# Patient Record
Sex: Female | Born: 1995 | Race: White | Hispanic: No | Marital: Single | Smoking: Current every day smoker
Health system: Southern US, Community
[De-identification: ages and names within clinical notes are randomized; demographics above are authoritative.]

## PROBLEM LIST (undated history)

## (undated) DIAGNOSIS — A1801 Tuberculosis of spine: Secondary | ICD-10-CM

## (undated) DIAGNOSIS — F419 Anxiety disorder, unspecified: Secondary | ICD-10-CM

---
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
## 2015-11-04 ENCOUNTER — Ambulatory Visit
Admission: EM | Admit: 2015-11-04 | Discharge: 2015-11-04 | Disposition: A | Discharge status: Home / Self Care | Payer: BLUE CROSS/BLUE SHIELD | Attending: Family Medicine | Admitting: Family Medicine

## 2015-11-04 ENCOUNTER — Encounter: Payer: Self-pay | Admitting: Emergency Medicine

## 2015-11-04 DIAGNOSIS — N3001 Acute cystitis with hematuria: Secondary | ICD-10-CM

## 2015-11-04 LAB — URINALYSIS COMPLETE WITH MICROSCOPIC (ARMC ONLY): PH: 0 — AB (ref 5.0–8.0)

## 2015-11-04 MED ORDER — SULFAMETHOXAZOLE-TRIMETHOPRIM 800-160 MG PO TABS: 1.0000 | ORAL_TABLET | Freq: Two times a day (BID) | ORAL | Status: DC

## 2015-11-04 MED ORDER — PHENAZOPYRIDINE HCL 200 MG PO TABS: 200.0000 mg | ORAL_TABLET | Freq: Three times a day (TID) | ORAL | Status: DC

## 2015-11-04 NOTE — ED Provider Notes (Signed)
CSN: [id number]     Arrival date & time 11/04/15  1154 History   First MD Initiated Contact with Patient 11/04/15 1237     Chief Complaint  Patient presents with  . Dysuria   (Consider location/radiation/quality/duration/timing/severity/associated sxs/prior Treatment) HPI   19 year old female who presents with burning when urinating and blood in her urine since yesterday. [place] any back pain nausea vomiting fever or chills. Her first UTI. She is been drinking cranberry juice that her mother gave her but this has not been effective.She denies any vaginal discharge. She denies any pregnancy possibility and states that she is not sexually active.  History reviewed. No pertinent past medical history. History reviewed. No pertinent past surgical history. History reviewed. No pertinent family history. Social History  Substance Use Topics  . Smoking status: Former Games developermoker  . Smokeless tobacco: None  . Alcohol Use: No   OB History    No data available     Review of Systems  Constitutional: Negative for fever, chills and fatigue.  Genitourinary: Positive for dysuria, urgency, frequency, hematuria and vaginal discharge. Negative for flank pain, vaginal bleeding and vaginal pain.  All other systems reviewed and are negative.   Allergies  Review of patient's allergies indicates no known allergies.  Home Medications   Prior to Admission medications   Medication Sig Start Date End Date Taking? Authorizing Provider  phenazopyridine (PYRIDIUM) 200 MG tablet Take 1 tablet (200 mg total) by mouth 3 (three) times daily. 11/04/15   Lutricia FeilWilliam P Alvina Strother, PA-C  sulfamethoxazole-trimethoprim (BACTRIM DS,SEPTRA DS) 800-160 MG tablet Take 1 tablet by mouth 2 (two) times daily. 11/04/15   Lutricia FeilWilliam P Abdulhadi Stopa, PA-C   Meds Ordered and Administered this Visit  Medications - No data to display  BP 116/81 mmHg  Pulse 84  Temp(Src) 96.9 F (36.1 C) (Tympanic)  Resp 16  Ht 5' 3.5" (1.613 m)  Wt 160 lb (72.576  kg)  BMI 27.89 kg/m2  SpO2 100%  LMP 10/25/2015 (Exact Date) No data found.   Physical Exam  Constitutional: She is oriented to person, place, and time. She appears well-developed and well-nourished. No distress.  HENT:  Head: Normocephalic and atraumatic.  Eyes: Conjunctivae are normal. Pupils are equal, round, and reactive to light.  Neck: Normal range of motion. Neck supple.  Pulmonary/Chest: Effort normal and breath sounds normal. No respiratory distress. She has no wheezes. She has no rales.  Abdominal: Soft. Bowel sounds are normal. She exhibits no distension. There is tenderness. There is no rebound and no guarding.  Musculoskeletal: Normal range of motion. She exhibits no tenderness.  Neurological: She is alert and oriented to person, place, and time.  Skin: Skin is warm and dry. No rash noted. She is not diaphoretic. No erythema.  Psychiatric: She has a normal mood and affect. Her behavior is normal. Judgment and thought content normal.  Nursing note and vitals reviewed.   ED Course  Procedures (including critical care time)  Labs Review Labs Reviewed  URINALYSIS COMPLETEWITH MICROSCOPIC ([facility] ONLY) - Abnormal; Notable for the following:    Color, Urine RED (*)    APPearance CLOUDY (*)    Glucose, UA   (*)    Value: TEST NOT REPORTED DUE TO COLOR INTERFERENCE OF URINE PIGMENT   Bilirubin Urine   (*)    Value: TEST NOT REPORTED DUE TO COLOR INTERFERENCE OF URINE PIGMENT   Ketones, ur   (*)    Value: TEST NOT REPORTED DUE TO COLOR INTERFERENCE OF URINE PIGMENT  Hgb urine dipstick   (*)    Value: TEST NOT REPORTED DUE TO COLOR INTERFERENCE OF URINE PIGMENT   pH 0.0 (*)    Protein, ur   (*)    Value: TEST NOT REPORTED DUE TO COLOR INTERFERENCE OF URINE PIGMENT   Nitrite   (*)    Value: TEST NOT REPORTED DUE TO COLOR INTERFERENCE OF URINE PIGMENT   Leukocytes, UA   (*)    Value: TEST NOT REPORTED DUE TO COLOR INTERFERENCE OF URINE PIGMENT   Bacteria, UA FEW (*)     Squamous Epithelial / LPF 0-5 (*)    All other components within normal limits    Imaging Review No results found.   Visual Acuity Review  Right Eye Distance:   Left Eye Distance:   Bilateral Distance:    Right Eye Near:   Left Eye Near:    Bilateral Near:         MDM   1. Acute cystitis with hematuria    Discharge Medication List as of 11/04/2015 12:59 PM    START taking these medications   Details  phenazopyridine (PYRIDIUM) 200 MG tablet Take 1 tablet (200 mg total) by mouth 3 (three) times daily., Starting 11/04/2015, Until Discontinued, Normal    sulfamethoxazole-trimethoprim (BACTRIM DS,SEPTRA DS) 800-160 MG tablet Take 1 tablet by mouth 2 (two) times daily., Starting 11/04/2015, Until Discontinued, Normal      Plan: 1. Test/x-ray results and diagnosis reviewed with patient 2. rx as per orders; risks, benefits, potential side effects reviewed with patient 3. Recommend supportive treatment with fluids. Call 48 hours for final C&S. 4. F/u prn if symptoms worsen or don't improve    Lutricia Feil, PA-C 11/04/15 1304

## 2015-11-04 NOTE — ED Notes (Signed)
Patient c/o burning when urinating and blood in her urine since yesterday.  Patient denies N/V.

## 2015-11-04 NOTE — Discharge Instructions (Signed)

## 2016-05-23 ENCOUNTER — Encounter: Payer: Self-pay | Admitting: Emergency Medicine

## 2016-05-23 DIAGNOSIS — B373 Candidiasis of vulva and vagina: Secondary | ICD-10-CM | POA: Insufficient documentation

## 2016-05-23 DIAGNOSIS — N760 Acute vaginitis: Secondary | ICD-10-CM | POA: Insufficient documentation

## 2016-05-23 DIAGNOSIS — Z87891 Personal history of nicotine dependence: Secondary | ICD-10-CM | POA: Insufficient documentation

## 2016-05-23 LAB — URINALYSIS COMPLETE WITH MICROSCOPIC (ARMC ONLY)
Bilirubin Urine: NEGATIVE
GLUCOSE, UA: NEGATIVE mg/dL
Ketones, ur: NEGATIVE mg/dL
Nitrite: NEGATIVE
PROTEIN: NEGATIVE mg/dL
Specific Gravity, Urine: 1.004 — ABNORMAL LOW (ref 1.005–1.030)
pH: 7 (ref 5.0–8.0)

## 2016-05-23 NOTE — ED Notes (Signed)
Patient ambulatory to triage with steady gait, without difficulty or distress noted; pt reports vaginal pain/irritation since yesterday with no accomp symptoms

## 2016-05-24 ENCOUNTER — Emergency Department
Admission: EM | Admit: 2016-05-24 | Discharge: 2016-05-24 | Disposition: A | Discharge status: Home / Self Care | Payer: BLUE CROSS/BLUE SHIELD | Attending: Emergency Medicine | Admitting: Emergency Medicine

## 2016-05-24 DIAGNOSIS — B3731 Acute candidiasis of vulva and vagina: Secondary | ICD-10-CM

## 2016-05-24 DIAGNOSIS — N760 Acute vaginitis: Secondary | ICD-10-CM

## 2016-05-24 DIAGNOSIS — B373 Candidiasis of vulva and vagina: Secondary | ICD-10-CM

## 2016-05-24 DIAGNOSIS — B9689 Other specified bacterial agents as the cause of diseases classified elsewhere: Secondary | ICD-10-CM

## 2016-05-24 LAB — WET PREP, GENITAL
SPERM: NONE SEEN
Trich, Wet Prep: NONE SEEN

## 2016-05-24 LAB — CHLAMYDIA/NGC RT PCR (ARMC ONLY)
CHLAMYDIA TR: NOT DETECTED
N gonorrhoeae: NOT DETECTED

## 2016-05-24 LAB — POCT PREGNANCY, URINE: Preg Test, Ur: NEGATIVE

## 2016-05-24 MED ORDER — FLUCONAZOLE 50 MG PO TABS
150.0000 mg | ORAL_TABLET | ORAL | Status: AC
  Administered 2016-05-24: 150 mg via ORAL
  Filled 2016-05-24: qty 1

## 2016-05-24 MED ORDER — METRONIDAZOLE 500 MG PO TABS: 500.0000 mg | ORAL_TABLET | Freq: Two times a day (BID) | ORAL | Status: DC

## 2016-05-24 MED ORDER — METRONIDAZOLE 500 MG PO TABS
500.0000 mg | ORAL_TABLET | Freq: Once | ORAL | Status: AC
  Administered 2016-05-24: 500 mg via ORAL
  Filled 2016-05-24: qty 1

## 2016-05-24 MED ORDER — KETOROLAC TROMETHAMINE 60 MG/2ML IM SOLN
30.0000 mg | Freq: Once | INTRAMUSCULAR | Status: AC
  Administered 2016-05-24: 30 mg via INTRAMUSCULAR
  Filled 2016-05-24: qty 2

## 2016-05-24 MED ORDER — FLUCONAZOLE 150 MG PO TABS: ORAL_TABLET | ORAL | Status: AC

## 2016-05-24 NOTE — ED Provider Notes (Signed)
[facility] Emergency Department Provider Note  ____________________________________________  Time seen: Approximately 1:50 AM  I have reviewed the triage vital signs and the nursing notes.   HISTORY  Chief Complaint Vaginitis    HPI Kristy Knight is a 20 y.o. female who presents with 2 days of worsening vaginal/vulvar pain.  She reports that yesterday the pain was mild but it is gradually gotten worse and it feels like a severe burning at all times.  It is worse with standing and walking.  She denies any vaginal discharge.  It hurts when she urinates as well but because it hurts all the time she cannot say if it makes it worse or not.  She denies fever/chills, chest pain, shortness of breath, nausea, vomiting, diarrhea, abdominal pain.  She is sexually active with one partner and states that they always use condoms.  It is a relatively new relationship.  She has not noticed any rash in the area but it is so painful she is not certain.   History reviewed. No pertinent past medical history.  There are no active problems to display for this patient.   History reviewed. No pertinent past surgical history.  Current Outpatient Rx  Name  Route  Sig  Dispense  Refill  . fluconazole (DIFLUCAN) 150 MG tablet      Take 1 tablet (150 mg) by mouth if you still have symptoms on July 2nd (72 hours after initial dose)   1 tablet   0   . metroNIDAZOLE (FLAGYL) 500 MG tablet   Oral   Take 1 tablet (500 mg total) by mouth 2 (two) times daily.   14 tablet   0   . phenazopyridine (PYRIDIUM) 200 MG tablet   Oral   Take 1 tablet (200 mg total) by mouth 3 (three) times daily.   6 tablet   0   . sulfamethoxazole-trimethoprim (BACTRIM DS,SEPTRA DS) 800-160 MG tablet   Oral   Take 1 tablet by mouth 2 (two) times daily.   10 tablet   0     Allergies Review of patient's allergies indicates no known allergies.  No family history on file.  Social  History Social History  Substance Use Topics  . Smoking status: Former Smoker -- 0.50 packs/day    Types: Cigarettes  . Smokeless tobacco: None  . Alcohol Use: No    Review of Systems Constitutional: No fever/chills Cardiovascular: Denies chest pain. Respiratory: Denies shortness of breath. Gastrointestinal: No abdominal pain.  No nausea, no vomiting.  No diarrhea.  No constipation. Genitourinary: +vulvar/vaginal pain and +dysuria.  Denies vaginal discharge Musculoskeletal: Negative for back pain. Skin: Negative for rash. Neurological: Negative for headaches, focal weakness or numbness.  10-point ROS otherwise negative.  ____________________________________________   PHYSICAL EXAM:  VITAL SIGNS: ED Triage Vitals  Enc Vitals Group     BP 05/23/16 2334 137/91 mmHg     Pulse Rate 05/23/16 2334 87     Resp 05/23/16 2334 18     Temp 05/23/16 2334 97.9 F (36.6 C)     Temp Source 05/23/16 2334 Oral     SpO2 05/23/16 2334 100 %     Weight 05/23/16 2334 170 lb (77.111 kg)     Height 05/23/16 2334 5\' 3"  (1.6 m)     Head Cir --      Peak Flow --      Pain Score 05/23/16 2332 6     Pain Loc --  Pain Edu? --      Excl. in GC? --     Constitutional: Alert and oriented. Appears to be uncomfortable Head: Atraumatic. Neck: No stridor.  No meningeal signs.   Cardiovascular: Normal rate, regular rhythm. Good peripheral circulation. Grossly normal heart sounds.   Respiratory: Normal respiratory effort.  No retractions. Lungs CTAB. Gastrointestinal: Soft and nontender. No distention.  Genitourinary:  Severe tenderness to palpation of the labia majora and minora with some whitish discharge and irritation of the skin but without any vesicular lesions, abscesses, or other evidence of infection.  There is copious white thick discharge in the vaginal vault but no evidence of cervicitis.  The tenderness seems to be mostly external rather than internal Neurologic:  Normal speech and  language. No gross focal neurologic deficits are appreciated.  Skin:  Skin is warm, dry and intact. No rash noted. Psychiatric: Mood and affect are normal. Speech and behavior are normal.  ____________________________________________   LABS (all labs ordered are listed, but only abnormal results are displayed)  Labs Reviewed  WET PREP, GENITAL - Abnormal; Notable for the following:    Yeast Wet Prep HPF POC PRESENT (*)    Clue Cells Wet Prep HPF POC PRESENT (*)    WBC, Wet Prep HPF POC FEW (*)    All other components within normal limits  URINALYSIS COMPLETEWITH MICROSCOPIC ([facility] ONLY) - Abnormal; Notable for the following:    Color, Urine STRAW (*)    APPearance HAZY (*)    Specific Gravity, Urine 1.004 (*)    Hgb urine dipstick 1+ (*)    Leukocytes, UA 1+ (*)    Bacteria, UA MANY (*)    Squamous Epithelial / LPF 0-5 (*)    All other components within normal limits  CHLAMYDIA/NGC RT PCR ([facility] ONLY)  URINE CULTURE  POCT PREGNANCY, URINE   ____________________________________________  EKG  None ____________________________________________  RADIOLOGY   No results found.  ____________________________________________   PROCEDURES  Procedure(s) performed:   Procedures   ____________________________________________   INITIAL IMPRESSION / ASSESSMENT AND PLAN / ED COURSE  Pertinent labs & imaging results that were available during my care of the patient were reviewed by me and considered in my medical decision making (see chart for details).  Candidal vulvovaginitis with bacterial vaginosis as well.  Gave one dose fluconazole 150 mg by mouth in the ED and a prescription for another pill if she still is symptomatic in 72 hours.  Also gave Flagyl pill and Rx.  GC chlamydia negative.  Urine culture pending.  I gave my usual and customary return precautions.      ____________________________________________  FINAL CLINICAL IMPRESSION(S) / ED  DIAGNOSES  Final diagnoses:  Candidal vulvovaginitis  Bacterial vaginosis     MEDICATIONS GIVEN DURING THIS VISIT:  Medications  ketorolac (TORADOL) injection 30 mg (30 mg Intramuscular Given 05/24/16 0238)  fluconazole (DIFLUCAN) tablet 150 mg (150 mg Oral Given 05/24/16 0311)  metroNIDAZOLE (FLAGYL) tablet 500 mg (500 mg Oral Given 05/24/16 0313)     NEW OUTPATIENT MEDICATIONS STARTED DURING THIS VISIT:  New Prescriptions   FLUCONAZOLE (DIFLUCAN) 150 MG TABLET    Take 1 tablet (150 mg) by mouth if you still have symptoms on July 2nd (72 hours after initial dose)   METRONIDAZOLE (FLAGYL) 500 MG TABLET    Take 1 tablet (500 mg total) by mouth 2 (two) times daily.      Note:  This document was prepared using Dragon voice recognition software and may include unintentional  dictation errors.   Loleta Rose, MD 05/24/16 [id number]

## 2016-05-24 NOTE — Discharge Instructions (Signed)
If your urine culture or other swabs indicate that you need additional antibiotics, a nurse will call you to follow up.  Bacterial Vaginosis Bacterial vaginosis is a vaginal infection that occurs when the normal balance of bacteria in the vagina is disrupted. It results from an overgrowth of certain bacteria. This is the most common vaginal infection in women of childbearing age. Treatment is important to prevent complications, especially in pregnant women, as it can cause a premature delivery. CAUSES  Bacterial vaginosis is caused by an increase in harmful bacteria that are normally present in smaller amounts in the vagina. Several different kinds of bacteria can cause bacterial vaginosis. However, the reason that the condition develops is not fully understood. RISK FACTORS Certain activities or behaviors can put you at an increased risk of developing bacterial vaginosis, including:  Having a new sex partner or multiple sex partners.  Douching.  Using an intrauterine device (IUD) for contraception. Women do not get bacterial vaginosis from toilet seats, bedding, swimming pools, or contact with objects around them. SIGNS AND SYMPTOMS  Some women with bacterial vaginosis have no signs or symptoms. Common symptoms include:  Grey vaginal discharge.  A fishlike odor with discharge, especially after sexual intercourse.  Itching or burning of the vagina and vulva.  Burning or pain with urination. DIAGNOSIS  Your health care provider will take a medical history and examine the vagina for signs of bacterial vaginosis. A sample of vaginal fluid may be taken. Your health care provider will look at this sample under a microscope to check for bacteria and abnormal cells. A vaginal pH test may also be done.  TREATMENT  Bacterial vaginosis may be treated with antibiotic medicines. These may be given in the form of a pill or a vaginal cream. A second round of antibiotics may be prescribed if the  condition comes back after treatment. Because bacterial vaginosis increases your risk for sexually transmitted diseases, getting treated can help reduce your risk for chlamydia, gonorrhea, HIV, and herpes. HOME CARE INSTRUCTIONS   Only take over-the-counter or prescription medicines as directed by your health care provider.  If antibiotic medicine was prescribed, take it as directed. Make sure you finish it even if you start to feel better.  Tell all sexual partners that you have a vaginal infection. They should see their health care provider and be treated if they have problems, such as a mild rash or itching.  During treatment, it is important that you follow these instructions:  Avoid sexual activity or use condoms correctly.  Do not douche.  Avoid alcohol as directed by your health care provider.  Avoid breastfeeding as directed by your health care provider. SEEK MEDICAL CARE IF:   Your symptoms are not improving after 3 days of treatment.  You have increased discharge or pain.  You have a fever. MAKE SURE YOU:   Understand these instructions.  Will watch your condition.  Will get help right away if you are not doing well or get worse. FOR MORE INFORMATION  Centers for Disease Control and Prevention, Division of STD Prevention: SolutionApps.co.zawww.cdc.gov/std American Sexual Health Association (ASHA): www.ashastd.org    This information is not intended to replace advice given to you by your health care provider. Make sure you discuss any questions you have with your health care provider.   Document Released: 11/12/2005 Document Revised: 12/03/2014 Document Reviewed: 06/24/2013 Elsevier Interactive Patient Education 2016 Elsevier Inc.  Bacterial Vaginosis Bacterial vaginosis is a vaginal infection that occurs when the normal balance  of bacteria in the vagina is disrupted. It results from an overgrowth of certain bacteria. This is the most common vaginal infection in women of  childbearing age. Treatment is important to prevent complications, especially in pregnant women, as it can cause a premature delivery. CAUSES  Bacterial vaginosis is caused by an increase in harmful bacteria that are normally present in smaller amounts in the vagina. Several different kinds of bacteria can cause bacterial vaginosis. However, the reason that the condition develops is not fully understood. RISK FACTORS Certain activities or behaviors can put you at an increased risk of developing bacterial vaginosis, including:  Having a new sex partner or multiple sex partners.  Douching.  Using an intrauterine device (IUD) for contraception. Women do not get bacterial vaginosis from toilet seats, bedding, swimming pools, or contact with objects around them. SIGNS AND SYMPTOMS  Some women with bacterial vaginosis have no signs or symptoms. Common symptoms include:  Grey vaginal discharge.  A fishlike odor with discharge, especially after sexual intercourse.  Itching or burning of the vagina and vulva.  Burning or pain with urination. DIAGNOSIS  Your health care provider will take a medical history and examine the vagina for signs of bacterial vaginosis. A sample of vaginal fluid may be taken. Your health care provider will look at this sample under a microscope to check for bacteria and abnormal cells. A vaginal pH test may also be done.  TREATMENT  Bacterial vaginosis may be treated with antibiotic medicines. These may be given in the form of a pill or a vaginal cream. A second round of antibiotics may be prescribed if the condition comes back after treatment. Because bacterial vaginosis increases your risk for sexually transmitted diseases, getting treated can help reduce your risk for chlamydia, gonorrhea, HIV, and herpes. HOME CARE INSTRUCTIONS   Only take over-the-counter or prescription medicines as directed by your health care provider.  If antibiotic medicine was prescribed, take  it as directed. Make sure you finish it even if you start to feel better.  Tell all sexual partners that you have a vaginal infection. They should see their health care provider and be treated if they have problems, such as a mild rash or itching.  During treatment, it is important that you follow these instructions:  Avoid sexual activity or use condoms correctly.  Do not douche.  Avoid alcohol as directed by your health care provider.  Avoid breastfeeding as directed by your health care provider. SEEK MEDICAL CARE IF:   Your symptoms are not improving after 3 days of treatment.  You have increased discharge or pain.  You have a fever. MAKE SURE YOU:   Understand these instructions.  Will watch your condition.  Will get help right away if you are not doing well or get worse. FOR MORE INFORMATION  Centers for Disease Control and Prevention, Division of STD Prevention: SolutionApps.co.zawww.cdc.gov/std American Sexual Health Association (ASHA): www.ashastd.org    This information is not intended to replace advice given to you by your health care provider. Make sure you discuss any questions you have with your health care provider.   Document Released: 11/12/2005 Document Revised: 12/03/2014 Document Reviewed: 06/24/2013 Elsevier Interactive Patient Education [employer]2016 Elsevier Inc.

## 2016-05-25 LAB — URINE CULTURE: Special Requests: NORMAL

## 2017-03-27 ENCOUNTER — Ambulatory Visit
Admission: EM | Admit: 2017-03-27 | Discharge: 2017-03-27 | Disposition: A | Discharge status: Home / Self Care | Payer: BLUE CROSS/BLUE SHIELD | Attending: Family Medicine | Admitting: Family Medicine

## 2017-03-27 ENCOUNTER — Encounter: Payer: Self-pay | Admitting: *Deleted

## 2017-03-27 DIAGNOSIS — R05 Cough: Secondary | ICD-10-CM | POA: Diagnosis not present

## 2017-03-27 DIAGNOSIS — R197 Diarrhea, unspecified: Secondary | ICD-10-CM | POA: Diagnosis not present

## 2017-03-27 DIAGNOSIS — R112 Nausea with vomiting, unspecified: Secondary | ICD-10-CM

## 2017-03-27 DIAGNOSIS — J069 Acute upper respiratory infection, unspecified: Secondary | ICD-10-CM

## 2017-03-27 DIAGNOSIS — B9789 Other viral agents as the cause of diseases classified elsewhere: Secondary | ICD-10-CM

## 2017-03-27 LAB — RAPID STREP SCREEN (MED CTR MEBANE ONLY): STREPTOCOCCUS, GROUP A SCREEN (DIRECT): NEGATIVE

## 2017-03-27 MED ORDER — ONDANSETRON 8 MG PO TBDP: 8.0000 mg | ORAL_TABLET | Freq: Three times a day (TID) | ORAL | 0 refills | Status: DC | PRN

## 2017-03-27 NOTE — ED Provider Notes (Signed)
[facility]    CSN: [id number] Arrival date & time: 03/27/17  1521     History   Chief Complaint Chief Complaint  Patient presents with  . Sore Throat  . Cough  . Fever  . Dizziness  . Emesis    HPI Kristy Knight is a 21 y.o. female.   The history is provided by the patient.  URI  Presenting symptoms: congestion, cough, rhinorrhea and sore throat   Severity:  Moderate Onset quality:  Sudden Duration:  3 days Timing:  Constant Progression:  Worsening Chronicity:  New Relieved by:  Nothing Ineffective treatments: dizziness, vomiting once or twice daily and mild diarrhea; no blood in emesis or stool. Associated symptoms: no headaches, no myalgias, no neck pain, no sinus pain and no wheezing   Risk factors: sick contacts   Risk factors: not elderly, no chronic cardiac disease, no chronic kidney disease, no chronic respiratory disease, no diabetes mellitus, no immunosuppression, no recent illness and no recent travel     History reviewed. No pertinent past medical history.  There are no active problems to display for this patient.   History reviewed. No pertinent surgical history.  OB History    No data available       Home Medications    Prior to Admission medications   Medication Sig Start Date End Date Taking? Authorizing Provider  metroNIDAZOLE (FLAGYL) 500 MG tablet Take 1 tablet (500 mg total) by mouth 2 (two) times daily. 05/24/16   Loleta Rose, MD  ondansetron (ZOFRAN ODT) 8 MG disintegrating tablet Take 1 tablet (8 mg total) by mouth every 8 (eight) hours as needed. 03/27/17   Payton Mccallum, MD  phenazopyridine (PYRIDIUM) 200 MG tablet Take 1 tablet (200 mg total) by mouth 3 (three) times daily. 11/04/15   Lutricia Feil, PA-C  sulfamethoxazole-trimethoprim (BACTRIM DS,SEPTRA DS) 800-160 MG tablet Take 1 tablet by mouth 2 (two) times daily. 11/04/15   Lutricia Feil, PA-C    Family History History reviewed. No pertinent family  history.  Social History Social History  Substance Use Topics  . Smoking status: Current Every Day Smoker    Packs/day: 1.00    Types: Cigarettes  . Smokeless tobacco: Never Used  . Alcohol use No     Allergies   Patient has no known allergies.   Review of Systems Review of Systems  HENT: Positive for congestion, rhinorrhea and sore throat. Negative for sinus pain.   Respiratory: Positive for cough. Negative for wheezing.   Musculoskeletal: Negative for myalgias and neck pain.  Neurological: Negative for headaches.     Physical Exam Triage Vital Signs ED Triage Vitals  Enc Vitals Group     BP 03/27/17 1612 (!) 117/91     Pulse Rate 03/27/17 1612 83     Resp 03/27/17 1612 16     Temp 03/27/17 1612 98.2 F (36.8 C)     Temp Source 03/27/17 1612 Oral     SpO2 03/27/17 1612 100 %     Weight 03/27/17 1613 170 lb (77.1 kg)     Height 03/27/17 1613  (1.6 m)     Head Circumference --      Peak Flow --      Pain Score 03/27/17 1614 0     Pain Loc --      Pain Edu? --      Excl. in GC? --    No data found.   Updated Vital Signs BP (!) 117/91 (  BP Location: Left Arm)   Pulse 83   Temp 98.2 F (36.8 C) (Oral)   Resp 16   Ht  (1.6 m)   Wt 170 lb (77.1 kg)   LMP  (LMP Unknown)   SpO2 100%   BMI 30.11 kg/m   Visual Acuity Right Eye Distance:   Left Eye Distance:   Bilateral Distance:    Right Eye Near:   Left Eye Near:    Bilateral Near:     Physical Exam  Constitutional: She appears well-developed and well-nourished. No distress.  HENT:  Head: Normocephalic and atraumatic.  Right Ear: Tympanic membrane, external ear and ear canal normal.  Left Ear: Tympanic membrane, external ear and ear canal normal.  Nose: Rhinorrhea present. No mucosal edema, nose lacerations, sinus tenderness, nasal deformity, septal deviation or nasal septal hematoma. No epistaxis.  No foreign bodies. Right sinus exhibits no maxillary sinus tenderness and no frontal sinus  tenderness. Left sinus exhibits no maxillary sinus tenderness and no frontal sinus tenderness.  Mouth/Throat: Uvula is midline, oropharynx is clear and moist and mucous membranes are normal. No oropharyngeal exudate.  Eyes: Conjunctivae and EOM are normal. Pupils are equal, round, and reactive to light. Right eye exhibits no discharge. Left eye exhibits no discharge. No scleral icterus.  Neck: Normal range of motion. Neck supple. No thyromegaly present.  Cardiovascular: Normal rate, regular rhythm and normal heart sounds.   Pulmonary/Chest: Effort normal and breath sounds normal. No respiratory distress. She has no wheezes. She has no rales.  Abdominal: Soft. Bowel sounds are normal. She exhibits no distension and no mass. There is no tenderness. There is no rebound and no guarding. No hernia.  Lymphadenopathy:    She has no cervical adenopathy.  Skin: No rash noted. She is not diaphoretic.  Nursing note and vitals reviewed.    UC Treatments / Results  Labs (all labs ordered are listed, but only abnormal results are displayed) Labs Reviewed  RAPID STREP SCREEN (NOT AT [facility])  CULTURE, GROUP A STREP [facility])    EKG  EKG Interpretation None       Radiology No results found.  Procedures Procedures (including critical care time)  Medications Ordered in UC Medications - No data to display   Initial Impression / Assessment and Plan / UC Course  I have reviewed the triage vital signs and the nursing notes.  Pertinent labs & imaging results that were available during my care of the patient were reviewed by me and considered in my medical decision making (see chart for details).       Final Clinical Impressions(s) / UC Diagnoses   Final diagnoses:  Viral URI with cough  Non-intractable vomiting with nausea, unspecified vomiting type  Diarrhea, unspecified type    New Prescriptions Discharge Medication List as of 03/27/2017  5:21 PM    START taking these medications    Details  ondansetron (ZOFRAN ODT) 8 MG disintegrating tablet Take 1 tablet (8 mg total) by mouth every 8 (eight) hours as needed., Starting Wed 03/27/2017, Normal       1. Lab results and diagnosis reviewed with patient 2. rx as per orders above; reviewed possible side effects, interactions, risks and benefits  3. Recommend supportive treatment with rest, fluids, salt water gargles 4. Follow-up prn if symptoms worsen or don't improve   Payton Mccallum, MD 03/27/17 1820

## 2017-03-27 NOTE — ED Triage Notes (Signed)
Patient started having symptoms of sore throat, cough, dizziness, and vomiting 2 days ago.

## 2017-03-30 LAB — CULTURE, GROUP A STREP (THRC)

## 2017-06-02 ENCOUNTER — Encounter: Payer: Self-pay | Admitting: Emergency Medicine

## 2017-06-02 DIAGNOSIS — M79672 Pain in left foot: Secondary | ICD-10-CM | POA: Diagnosis not present

## 2017-06-02 DIAGNOSIS — R202 Paresthesia of skin: Secondary | ICD-10-CM | POA: Diagnosis not present

## 2017-06-02 DIAGNOSIS — R200 Anesthesia of skin: Secondary | ICD-10-CM | POA: Diagnosis present

## 2017-06-02 DIAGNOSIS — M79671 Pain in right foot: Secondary | ICD-10-CM | POA: Diagnosis not present

## 2017-06-02 DIAGNOSIS — F17210 Nicotine dependence, cigarettes, uncomplicated: Secondary | ICD-10-CM | POA: Insufficient documentation

## 2017-06-02 LAB — POCT PREGNANCY, URINE: PREG TEST UR: NEGATIVE

## 2017-06-02 NOTE — ED Triage Notes (Signed)
Pt presents to ED with c/o pain and numbness to her feet since Wednesday and numbness to her left hand tonight. Pt states she was at work when her hand suddenly went numb and she dropped food she was carrying.  Pt states her hand does not have any pain at this time. Mom is concerned because diabetes runs in their family. Pt reports frequent urination but states she is drinking water all the time so she thinks it may be due to that. Denies any other symptoms at this time.

## 2017-06-03 ENCOUNTER — Emergency Department
Admission: EM | Admit: 2017-06-03 | Discharge: 2017-06-03 | Disposition: A | Discharge status: Home / Self Care | Payer: BLUE CROSS/BLUE SHIELD | Attending: Emergency Medicine | Admitting: Emergency Medicine

## 2017-06-03 ENCOUNTER — Emergency Department: Payer: BLUE CROSS/BLUE SHIELD

## 2017-06-03 ENCOUNTER — Encounter: Payer: Self-pay | Admitting: Emergency Medicine

## 2017-06-03 DIAGNOSIS — R202 Paresthesia of skin: Secondary | ICD-10-CM

## 2017-06-03 DIAGNOSIS — M79671 Pain in right foot: Secondary | ICD-10-CM

## 2017-06-03 DIAGNOSIS — M79672 Pain in left foot: Secondary | ICD-10-CM

## 2017-06-03 HISTORY — DX: Anxiety disorder, unspecified: F41.9

## 2017-06-03 LAB — COMPREHENSIVE METABOLIC PANEL
ALBUMIN: 4.5 g/dL (ref 3.5–5.0)
ALT: 83 U/L — ABNORMAL HIGH (ref 14–54)
ANION GAP: 7 (ref 5–15)
AST: 54 U/L — ABNORMAL HIGH (ref 15–41)
Alkaline Phosphatase: 55 U/L (ref 38–126)
BUN: 13 mg/dL (ref 6–20)
CHLORIDE: 106 mmol/L (ref 101–111)
CO2: 28 mmol/L (ref 22–32)
Calcium: 9.7 mg/dL (ref 8.9–10.3)
Creatinine, Ser: 0.63 mg/dL (ref 0.44–1.00)
GFR calc non Af Amer: >60 mL/min (ref >60)
GLUCOSE: 91 mg/dL (ref 65–99)
Potassium: 4.2 mmol/L (ref 3.5–5.1)
SODIUM: 141 mmol/L (ref 135–145)
Total Bilirubin: 0.7 mg/dL (ref 0.3–1.2)
Total Protein: 7.4 g/dL (ref 6.5–8.1)

## 2017-06-03 LAB — CBC WITH DIFFERENTIAL/PLATELET
BASOS PCT: 1 %
Basophils Absolute: 0.1 10*3/uL (ref 0–0.1)
EOS ABS: 0.3 10*3/uL (ref 0–0.7)
EOS PCT: 5 %
HCT: 39.7 % (ref 35.0–47.0)
Hemoglobin: 13.3 g/dL (ref 12.0–16.0)
LYMPHS ABS: 2.4 10*3/uL (ref 1.0–3.6)
Lymphocytes Relative: 42 %
MCH: 30 pg (ref 26.0–34.0)
MCHC: 33.6 g/dL (ref 32.0–36.0)
MCV: 89.3 fL (ref 80.0–100.0)
MONOS PCT: 8 %
Monocytes Absolute: 0.5 10*3/uL (ref 0.2–0.9)
Neutro Abs: 2.5 10*3/uL (ref 1.4–6.5)
Neutrophils Relative %: 44 %
PLATELETS: 131 10*3/uL — AB (ref 150–440)
RBC: 4.44 MIL/uL (ref 3.80–5.20)
RDW: 12.3 % (ref 11.5–14.5)
WBC: 5.7 10*3/uL (ref 3.6–11.0)

## 2017-06-03 LAB — URINALYSIS, COMPLETE (UACMP) WITH MICROSCOPIC
BILIRUBIN URINE: NEGATIVE
Glucose, UA: NEGATIVE mg/dL
Hgb urine dipstick: NEGATIVE
KETONES UR: NEGATIVE mg/dL
LEUKOCYTES UA: NEGATIVE
Nitrite: NEGATIVE
PH: 6 (ref 5.0–8.0)
PROTEIN: NEGATIVE mg/dL
RBC / HPF: NONE SEEN RBC/hpf (ref 0–5)
Specific Gravity, Urine: 1.004 — ABNORMAL LOW (ref 1.005–1.030)

## 2017-06-03 LAB — GLUCOSE, CAPILLARY: Glucose-Capillary: 109 mg/dL — ABNORMAL HIGH (ref 65–99)

## 2017-06-03 NOTE — ED Notes (Signed)
MRI tech ellen called to notify of MRI. Pt removing jewelry in preparation for exam.

## 2017-06-03 NOTE — ED Notes (Signed)
Pt states has had bilateral foot pain with numbness for a week. 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 Pt with left hand grip that is less than right. Pt states decreased sensation to left hand during assessment. Pt  Complains of 2nd-5th toe bilaterally less sensation than great toes.

## 2017-06-03 NOTE — ED Provider Notes (Signed)
[facility] Emergency Department Provider Note  ____________________________________________   First MD Initiated Contact with Patient 06/03/17 [phone]     (approximate)  I have reviewed the triage vital signs and the nursing notes.   HISTORY  Chief Complaint Numbness    HPI Kristy Knight is a 21 y.o. female with a past medical history of anxietywho presents for evaluation of a variety of symptoms.  She is most concerned about symptoms that started yesterday afternoon or evening.  She is at work and reports that she was making a smoothie with her left hand when suddenly she dropped a smoothie.  She reports she had decreased sensation throughout her left arm and could not make it work correctly, with weakness and decreased mobility.  She denies any pain but reports loss of sensation and loss of motor function.  The symptoms have improved somewhat since that acute onset that occurred more than 8 hours ago, but she still reports weakness and numbness in the arm, feeling similar to when the arm falls asleep when you lie on it.  She denies any headache, neck pain, shortness of breath, chest pain, nausea, vomiting.  She describes symptoms as severe.  Nothing in particular makes the patient's symptoms better nor worse.    She also reports that for 4 or 5 days she has had pain and numbness in the bottoms of her feet.  On the left foot is mostly on the balls of her feet and on the right foot it is mostly her heel.  Standing on her feet all day long at work makes it worse and nothing in particular makes it better.  The symptoms are mild.  It feels like pins and needles and sharp pain.  No history of trauma or other injury.  Past Medical History:  Diagnosis Date  . Anxiety     There are no active problems to display for this patient.   History reviewed. No pertinent surgical history.  Prior to Admission medications   Medication Sig Start Date End Date Taking?  Authorizing Provider  ondansetron (ZOFRAN ODT) 8 MG disintegrating tablet Take 1 tablet (8 mg total) by mouth every 8 (eight) hours as needed. Patient not taking: Reported on 06/03/2017 03/27/17   Payton Mccallum, MD  phenazopyridine (PYRIDIUM) 200 MG tablet Take 1 tablet (200 mg total) by mouth 3 (three) times daily. Patient not taking: Reported on 06/03/2017 11/04/15   Lutricia Feil, PA-C    Allergies Patient has no known allergies.  History reviewed. No pertinent family history.  Social History Social History  Substance Use Topics  . Smoking status: Current Every Day Smoker    Packs/day: 1.00    Types: Cigarettes  . Smokeless tobacco: Never Used  . Alcohol use No    Review of Systems Constitutional: No fever/chills Eyes: No visual changes. ENT: No sore throat. Cardiovascular: Denies chest pain. Respiratory: Denies shortness of breath. Gastrointestinal: No abdominal pain.  No nausea, no vomiting.  No diarrhea.  No constipation. Genitourinary: Negative for dysuria. Musculoskeletal: Negative for neck pain.  Negative for back pain. Integumentary: Negative for rash. Neurological: Negative for headaches, focal weakness or numbness.   ____________________________________________   PHYSICAL EXAM:  VITAL SIGNS: ED Triage Vitals  Enc Vitals Group     BP 06/02/17 2319 (!) 142/96     Pulse Rate 06/02/17 2319 88     Resp 06/02/17 2319 16     Temp 06/02/17 2319 98.5 F (36.9 C)  Temp Source 06/02/17 2319 Oral     SpO2 06/02/17 2319 100 %     Weight 06/02/17 2318 79.4 kg (175 lb)     Height 06/02/17 2318 1.613 m (5' 3.5")     Head Circumference --      Peak Flow --      Pain Score 06/02/17 2331 4     Pain Loc --      Pain Edu? --      Excl. in GC? --     Constitutional: Alert and oriented. Well appearing and in no acute distress. Eyes: Conjunctivae are normal. PERRL. EOMI. Head: Atraumatic. Nose: No congestion/rhinnorhea. Mouth/Throat: Mucous membranes are  moist. Neck: No stridor.  No meningeal signs.  No cervical spine tenderness to palpation. Cardiovascular: Normal rate, regular rhythm. Good peripheral circulation. Grossly normal heart sounds. Respiratory: Normal respiratory effort.  No retractions. Lungs CTAB. Gastrointestinal: Soft and nontender. No distention.  Musculoskeletal: No lower extremity tenderness nor edema. No gross deformities of extremities.Tenderness to palpation of the heel of the right foot on the plantar surface and the plantar surface of the ball of the feet on the left foot. Neurologic:  Normal speech and language.  On exam, the patient reports decreased sensation to light touch throughout her left upper extremity.  She has decreased strength with grip, biceps flexion, and tricep extension with the left upper extremity compared to the right.  Her right lower extremity strength is intact and equal bilaterally.  She reports some numbness in the bottoms of her feet Skin:  Skin is warm, dry and intact. No rash noted. Psychiatric: Mood and affect are normal. Speech and behavior are normal.  ____________________________________________   LABS (all labs ordered are listed, but only abnormal results are displayed)  Labs Reviewed  CBC WITH DIFFERENTIAL/PLATELET - Abnormal; Notable for the following:       Result Value   Platelets 131 (*)    All other components within normal limits  COMPREHENSIVE METABOLIC PANEL - Abnormal; Notable for the following:    AST 54 (*)    ALT 83 (*)    All other components within normal limits  URINALYSIS, COMPLETE (UACMP) WITH MICROSCOPIC - Abnormal; Notable for the following:    Color, Urine STRAW (*)    APPearance CLEAR (*)    Specific Gravity, Urine 1.004 (*)    Bacteria, UA RARE (*)    Squamous Epithelial / LPF 0-5 (*)    All other components within normal limits  GLUCOSE, CAPILLARY - Abnormal; Notable for the following:    Glucose-Capillary 109 (*)    All other components within normal  limits  CBG MONITORING, ED  POC URINE PREG, ED  POCT PREGNANCY, URINE   ____________________________________________  EKG  None - EKG not ordered by ED physician ____________________________________________  RADIOLOGY   Mr Brain Wo Contrast  Result Date: 06/03/2017 CLINICAL DATA:  Initial evaluation for acute onset left arm weakness and numbness, numbness in bilateral feet. EXAM: MRI HEAD WITHOUT CONTRAST MRI CERVICAL SPINE WITHOUT CONTRAST TECHNIQUE: Multiplanar, multiecho pulse sequences of the brain and surrounding structures, and cervical spine, to include the craniocervical junction and cervicothoracic junction, were obtained without intravenous contrast. COMPARISON:  None. FINDINGS: MRI HEAD FINDINGS Brain: Cerebral volume within normal limits. No focal parenchymal signal abnormality. No abnormal foci of restricted diffusion to suggest acute or subacute ischemia. Gray-white matter differentiation well maintained. No evidence for chronic infarction. No acute or chronic intracranial hemorrhage. No mass lesion, midline shift or mass effect. No hydrocephalus.  No extra-axial fluid collection. Major dural sinuses are grossly patent. Pituitary gland suprasellar region within normal limits. Vascular: Major intracranial vascular flow voids are well maintained. Skull and upper cervical spine: Craniocervical junction normal. Bone marrow signal intensity within normal limits. No scalp soft tissue abnormality. Sinuses/Orbits: Globes and orbital soft tissues within normal limits. Paranasal sinuses are largely clear, although a small retention cysts noted within the right maxillary sinus. No mastoid effusion. Inner ear structures normal. MRI CERVICAL SPINE FINDINGS Alignment: Straightening with slight reversal of the normal cervical lordosis. No listhesis. Vertebrae: Vertebral body heights maintained. No evidence for acute or chronic fracture. Signal intensity within the vertebral body bone marrow within  normal limits. No discrete or worrisome osseous lesions. No abnormal marrow edema. Cord: Signal intensity within the cervical spinal cord is normal. Posterior Fossa, vertebral arteries, paraspinal tissues: Paraspinous soft tissues within normal limits. Normal intravascular flow voids present within the vertebral arteries bilaterally. Disc levels: No significant degenerative changes seen within the cervical spine. No disc bulge or disc protrusion. No canal or foraminal stenosis. IMPRESSION: 1. Normal MRI of the brain. No acute intracranial process identified. 2. Normal MRI of the cervical spine. Electronically Signed   By: Rise Mu M.D.   On: 06/03/2017 06:45   Mr Cervical Spine Wo Contrast  Result Date: 06/03/2017 CLINICAL DATA:  Initial evaluation for acute onset left arm weakness and numbness, numbness in bilateral feet. EXAM: MRI HEAD WITHOUT CONTRAST MRI CERVICAL SPINE WITHOUT CONTRAST TECHNIQUE: Multiplanar, multiecho pulse sequences of the brain and surrounding structures, and cervical spine, to include the craniocervical junction and cervicothoracic junction, were obtained without intravenous contrast. COMPARISON:  None. FINDINGS: MRI HEAD FINDINGS Brain: Cerebral volume within normal limits. No focal parenchymal signal abnormality. No abnormal foci of restricted diffusion to suggest acute or subacute ischemia. Gray-white matter differentiation well maintained. No evidence for chronic infarction. No acute or chronic intracranial hemorrhage. No mass lesion, midline shift or mass effect. No hydrocephalus. No extra-axial fluid collection. Major dural sinuses are grossly patent. Pituitary gland suprasellar region within normal limits. Vascular: Major intracranial vascular flow voids are well maintained. Skull and upper cervical spine: Craniocervical junction normal. Bone marrow signal intensity within normal limits. No scalp soft tissue abnormality. Sinuses/Orbits: Globes and orbital soft tissues  within normal limits. Paranasal sinuses are largely clear, although a small retention cysts noted within the right maxillary sinus. No mastoid effusion. Inner ear structures normal. MRI CERVICAL SPINE FINDINGS Alignment: Straightening with slight reversal of the normal cervical lordosis. No listhesis. Vertebrae: Vertebral body heights maintained. No evidence for acute or chronic fracture. Signal intensity within the vertebral body bone marrow within normal limits. No discrete or worrisome osseous lesions. No abnormal marrow edema. Cord: Signal intensity within the cervical spinal cord is normal. Posterior Fossa, vertebral arteries, paraspinal tissues: Paraspinous soft tissues within normal limits. Normal intravascular flow voids present within the vertebral arteries bilaterally. Disc levels: No significant degenerative changes seen within the cervical spine. No disc bulge or disc protrusion. No canal or foraminal stenosis. IMPRESSION: 1. Normal MRI of the brain. No acute intracranial process identified. 2. Normal MRI of the cervical spine. Electronically Signed   By: Rise Mu M.D.   On: 06/03/2017 06:45    ____________________________________________   PROCEDURES  Critical Care performed: No   Procedure(s) performed:   Procedures   ____________________________________________   INITIAL IMPRESSION / ASSESSMENT AND PLAN / ED COURSE  Pertinent labs & imaging results that were available during my care of the patient were  reviewed by me and considered in my medical decision making (see chart for details).  The patient is well-appearing and in no acute distress.  She is anxious at baseline and makes multiple comments either about the blood pressure cuff being uncomfortable, commenting on her blood pressure and vital sign measurements, wondering about the cause of her symptoms, etc.  Her mother is with her at bedside and commented on the patient's history of anxiety as a child as well as  now as an adult.  I had a frank discussion with both of them and explained that I feel that her physical exam is somewhat inconsistent and that I think she is not giving me an accurate representation of the strength available in her left arm.  However, given the history that she gives of acute onset weakness and loss of sensation of the left upper extremity, as well as the current decreased strength of the left upper extremity on exam, I have no choice but to proceed with MR brain to rule out CVA as well as MR cervical spine to rule out any spinal impingement that could result in her symptoms.  She and her mother understand my point, particularly given that she has no risk factors for stroke other than that she is a tobacco user.  However she is insistent that she is giving me her best effort on exam even though she did make one comment that "maybe it is a mental block".  We are proceeding with MR imaging as described above to rule out acute or emergent causes of her signs and symptoms.  I do not feel there is any benefit to obtaining imaging of her thoracic or lumbar spine as the most notable deficit is in the left upper extremity and her foot complaints can be attributed to being on her feet all day for work.   Clinical Course as of Jun 04 711  Mon Jun 03, 2017  0704 Normal MRIs with no evidence of any acute abnormalities.  I updated the patient and her mother about the reassuring results of the MRIs and they both stated that it makes him feel better.  There is no evidence of acute or emergent medical condition at this time and I suspect that the patient's symptoms are more related to anxiety versus "psych NOS".  I will give them follow-up information about how to establish a PCP, neurology follow-up, and podiatry follow-up.  I gave my usual and customary return precautions.   They are both comfortable with the plan to go home at this time.  [CF]    Clinical Course User Index [CF] Loleta RoseForbach, Keyden Pavlov, MD     ____________________________________________  FINAL CLINICAL IMPRESSION(S) / ED DIAGNOSES  Final diagnoses:  Paresthesia  Bilateral foot pain     MEDICATIONS GIVEN DURING THIS VISIT:  Medications - No data to display   NEW OUTPATIENT MEDICATIONS STARTED DURING THIS VISIT:  New Prescriptions   No medications on file    Modified Medications   No medications on file    Discontinued Medications   METRONIDAZOLE (FLAGYL) 500 MG TABLET    Take 1 tablet (500 mg total) by mouth 2 (two) times daily.   SULFAMETHOXAZOLE-TRIMETHOPRIM (BACTRIM DS,SEPTRA DS) 800-160 MG TABLET    Take 1 tablet by mouth 2 (two) times daily.     Note:  This document was prepared using Dragon voice recognition software and may include unintentional dictation errors.    Loleta RoseForbach, Julie Paolini, MD 06/03/17 [phone]

## 2017-06-03 NOTE — Discharge Instructions (Signed)
Your workup in the Emergency Department today was reassuring.  We did not find any specific abnormalities.  We recommend you drink plenty of fluids, take your regular medications and/or any new ones prescribed today, and follow up with the doctor(s) listed in these documents as recommended.  Return to the Emergency Department if you develop new or worsening symptoms that concern you.  

## 2017-06-03 NOTE — ED Notes (Signed)
Pt to MRI

## 2017-09-25 ENCOUNTER — Emergency Department
Admission: EM | Admit: 2017-09-25 | Discharge: 2017-09-25 | Disposition: A | Discharge status: Home / Self Care | Payer: BLUE CROSS/BLUE SHIELD | Attending: Emergency Medicine | Admitting: Emergency Medicine

## 2017-09-25 DIAGNOSIS — F419 Anxiety disorder, unspecified: Secondary | ICD-10-CM | POA: Insufficient documentation

## 2017-09-25 DIAGNOSIS — R1033 Periumbilical pain: Secondary | ICD-10-CM | POA: Diagnosis not present

## 2017-09-25 DIAGNOSIS — L0291 Cutaneous abscess, unspecified: Secondary | ICD-10-CM

## 2017-09-25 DIAGNOSIS — R0981 Nasal congestion: Secondary | ICD-10-CM | POA: Diagnosis present

## 2017-09-25 DIAGNOSIS — J069 Acute upper respiratory infection, unspecified: Secondary | ICD-10-CM | POA: Insufficient documentation

## 2017-09-25 DIAGNOSIS — F17210 Nicotine dependence, cigarettes, uncomplicated: Secondary | ICD-10-CM | POA: Diagnosis not present

## 2017-09-25 DIAGNOSIS — L03316 Cellulitis of umbilicus: Secondary | ICD-10-CM | POA: Diagnosis not present

## 2017-09-25 LAB — CBC
HCT: 39.5 % (ref 35.0–47.0)
HEMOGLOBIN: 13.2 g/dL (ref 12.0–16.0)
MCH: 30 pg (ref 26.0–34.0)
MCHC: 33.5 g/dL (ref 32.0–36.0)
MCV: 89.6 fL (ref 80.0–100.0)
Platelets: 164 10*3/uL (ref 150–440)
RBC: 4.41 MIL/uL (ref 3.80–5.20)
RDW: 13.4 % (ref 11.5–14.5)
WBC: 9.8 10*3/uL (ref 3.6–11.0)

## 2017-09-25 LAB — URINALYSIS, COMPLETE (UACMP) WITH MICROSCOPIC
Bilirubin Urine: NEGATIVE
Glucose, UA: NEGATIVE mg/dL
KETONES UR: NEGATIVE mg/dL
Leukocytes, UA: NEGATIVE
Nitrite: NEGATIVE
PH: 6 (ref 5.0–8.0)
Protein, ur: NEGATIVE mg/dL
SPECIFIC GRAVITY, URINE: 1.018 (ref 1.005–1.030)

## 2017-09-25 LAB — POCT PREGNANCY, URINE: PREG TEST UR: NEGATIVE

## 2017-09-25 LAB — COMPREHENSIVE METABOLIC PANEL
ALK PHOS: 73 U/L (ref 38–126)
ALT: 47 U/L (ref 14–54)
AST: 33 U/L (ref 15–41)
Albumin: 4.4 g/dL (ref 3.5–5.0)
Anion gap: 6 (ref 5–15)
BUN: 15 mg/dL (ref 6–20)
CALCIUM: 9.7 mg/dL (ref 8.9–10.3)
CO2: 28 mmol/L (ref 22–32)
CREATININE: 0.65 mg/dL (ref 0.44–1.00)
Chloride: 105 mmol/L (ref 101–111)
GFR calc non Af Amer: >60 mL/min (ref >60)
GLUCOSE: 115 mg/dL — AB (ref 65–99)
Potassium: 3.5 mmol/L (ref 3.5–5.1)
SODIUM: 139 mmol/L (ref 135–145)
Total Bilirubin: 1 mg/dL (ref 0.3–1.2)
Total Protein: 7.8 g/dL (ref 6.5–8.1)

## 2017-09-25 MED ORDER — DOXYCYCLINE HYCLATE 100 MG PO TABS
100.0000 mg | ORAL_TABLET | Freq: Once | ORAL | Status: AC
  Administered 2017-09-25: 100 mg via ORAL
  Filled 2017-09-25: qty 1

## 2017-09-25 MED ORDER — DOXYCYCLINE HYCLATE 100 MG PO CAPS: 100.0000 mg | ORAL_CAPSULE | Freq: Two times a day (BID) | ORAL | 0 refills | Status: DC

## 2017-09-25 NOTE — ED Triage Notes (Signed)
Pt in with congestion for over a week, does have a cough for a week. Also co redness to area above umbilicus with some pain. Also co upper abd pain at times.

## 2017-09-25 NOTE — Discharge Instructions (Signed)
You are evaluated for upper respiratory congestion, and your exam and evaluation are overall reassuring and I suspect a virus.  We discussed, you may try over-the-counter remedies such as tea with honey, and Mucinex to break up any mucus.  You did have a small skin abscess on the inside of the bellybutton with surrounding skin infection called cellulitis, and are being treated with course of antibiotic doxycycline.  Return to the emerge department immediately for any new or worsening condition including worsening abdominal pain, fever, new or worsening trouble swallowing or trouble breathing, or any other symptoms concerning to you.

## 2017-09-25 NOTE — ED Provider Notes (Signed)
[facility] Emergency Department Provider Note ____________________________________________   I have reviewed the triage vital signs and the triage nursing note.  HISTORY  Chief Complaint Nasal Congestion and Abdominal Pain   Historian Patient  HPI Kristy Knight is a 21 y.o. 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  She has a mild dry cough without any fevers.  No headache or vision changes.  No neurologic symptoms.  She is also complaining for a couple of days of pain around the bellybutton.  She states it feels dull and sometimes like pins poking.     Past Medical History:  Diagnosis Date  . Anxiety     There are no active problems to display for this patient.   No past surgical history on file.  Prior to Admission medications   Medication Sig Start Date End Date Taking? Authorizing Provider  doxycycline (VIBRAMYCIN) 100 MG capsule Take 1 capsule (100 mg total) by mouth 2 (two) times daily. 09/25/17   Governor Rooks, MD  ondansetron (ZOFRAN ODT) 8 MG disintegrating tablet Take 1 tablet (8 mg total) by mouth every 8 (eight) hours as needed. Patient not taking: Reported on 06/03/2017 03/27/17   Payton Mccallum, MD  phenazopyridine (PYRIDIUM) 200 MG tablet Take 1 tablet (200 mg total) by mouth 3 (three) times daily. Patient not taking: Reported on 06/03/2017 11/04/15   Lutricia Feil, PA-C    No Known Allergies  No family history on file.  Social History Social History  Substance Use Topics  . Smoking status: Current Every Day Smoker    Packs/day: 1.00    Types: Cigarettes  . Smokeless tobacco: Never Used  . Alcohol use No    Review of Systems  Constitutional: Negative for fever. Eyes: Negative for visual  changes. ENT: Negative for sore throat. Cardiovascular: Negative for chest pain. Respiratory: Negative for shortness of breath. Gastrointestinal: Negative for abdominal pain, vomiting and diarrhea. Genitourinary: Negative for dysuria. Musculoskeletal: Negative for back pain. Skin: Negative for rash. Neurological: Negative for headache.  ____________________________________________   PHYSICAL EXAM:  VITAL SIGNS: ED Triage Vitals  Enc Vitals Group     BP 09/25/17 0511 (!) 150/99     Pulse Rate 09/25/17 0511 94     Resp 09/25/17 0511 20     Temp 09/25/17 0511 98.5 F (36.9 C)     Temp Source 09/25/17 0511 Oral     SpO2 09/25/17 0511 100 %     Weight 09/25/17 0512 170 lb (77.1 kg)     Height 09/25/17 0512 5\' 3"  (1.6 m)     Head Circumference --      Peak Flow --      Pain Score 09/25/17 0511 5     Pain Loc --      Pain Edu? --      Excl. in GC? --      Constitutional: Alert and oriented. Well appearing and in no distress. HEENT   Head: Normocephalic and atraumatic.      Eyes: Conjunctivae are normal. Pupils equal and round.       Ears:         Nose: No congestion/rhinnorhea.   Mouth/Throat: Mucous membranes are moist.  Voice.  Very mild erythema in the posterior oropharynx.  No uvular deviation.  No tonsillar exudates or swelling.   Neck: No stridor.  No lymphadenopathy. Cardiovascular/Chest: Normal rate, regular rhythm.  No murmurs, rubs, or gallops. Respiratory: Normal respiratory effort without tachypnea nor retractions. Breath sounds are clear and equal bilaterally. No wheezes/rales/rhonchi. Gastrointestinal: Soft. No distention, no guarding, no rebound.  Small amount of skin erythema about the size of a quarter on the cephalic side of the umbilicus.  Inside the umbilicus on the superior aspect, deep there was a red bulge which was tiny, but with further widening with a Q-tip, with small amount of pus released, with additional manipulation, no additional pus  released. Genitourinary/rectal:Deferred Musculoskeletal: Nontender with normal range of motion in all extremities. No joint effusions.  No lower extremity tenderness.  No edema. Neurologic:  Normal speech and language. No gross or focal neurologic deficits are appreciated. Skin:  Skin is warm, dry and intact.  Abdomen small area of cellulitis as per HPI. Psychiatric: Mood and affect are normal. Speech and behavior are normal. Patient exhibits appropriate insight and judgment.   ____________________________________________  LABS (pertinent positives/negatives) I, Governor Rooksebecca Kinsley Holderman, MD the attending physician have reviewed the labs noted below.  Labs Reviewed  URINALYSIS, COMPLETE (UACMP) WITH MICROSCOPIC - Abnormal; Notable for the following:       Result Value   Color, Urine YELLOW (*)    APPearance HAZY (*)    Hgb urine dipstick LARGE (*)    Bacteria, UA RARE (*)    Squamous Epithelial / LPF 0-5 (*)    All other components within normal limits  COMPREHENSIVE METABOLIC PANEL - Abnormal; Notable for the following:    Glucose, Bld 115 (*)    All other components within normal limits  URINE CULTURE  CBC  POC URINE PREG, ED  POCT PREGNANCY, URINE    ____________________________________________    EKG I, Governor Rooksebecca Marrion Accomando, MD, the attending physician have personally viewed and interpreted all ECGs.  None ____________________________________________  RADIOLOGY All Xrays were viewed by me.  Imaging interpreted by Radiologist, and I, Governor Rooksebecca Tamaiya Bump, MD the attending physician have reviewed the radiologist interpretation noted below.  None __________________________________________  PROCEDURES  Procedure(s) performed: None  Critical Care performed: None  ____________________________________________  No current facility-administered medications on file prior to encounter.    Current Outpatient Prescriptions on File Prior to Encounter  Medication Sig Dispense Refill  .  ondansetron (ZOFRAN ODT) 8 MG disintegrating tablet Take 1 tablet (8 mg total) by mouth every 8 (eight) hours as needed. (Patient not taking: Reported on 06/03/2017) 6 tablet 0  . phenazopyridine (PYRIDIUM) 200 MG tablet Take 1 tablet (200 mg total) by mouth 3 (three) times daily. (Patient not taking: Reported on 06/03/2017) 6 tablet 0    ____________________________________________  ED COURSE / ASSESSMENT AND PLAN  Pertinent labs & imaging results that were available during my care of the patient were reviewed by me and considered in my medical decision making (see chart for details).    As far as upper respiratory congestion, seems consistent with viral URI.  No evidence of complications.  No lower respiratory symptoms right now.  As far as the abdominal discomfort, there is a small area of redness, and upon further investigation a small abscess spontaneously drained inside the umbilicus.  I discussed with her, placed on doxycycline to cover for possible MRSA.  No additional induration or pain within the abdomen, I am not suspicious of a deeper or larger abscess or communication.  DIFFERENTIAL DIAGNOSIS: Including but not limited to allergies,  viral URI, bronchitis, pneumonia, strep throat, pharyngitis, cellulitis, boil, eczema, etc.  CONSULTATIONS: None   Patient / Family / Caregiver informed of clinical course, medical decision-making process, and agree with plan.   I discussed return precautions, follow-up instructions, and discharge instructions with patient and/or family.  Discharge Instructions : You are evaluated for upper respiratory congestion, and your exam and evaluation are overall reassuring and I suspect a virus.  We discussed, you may try over-the-counter remedies such as tea with honey, and Mucinex to break up any mucus.  You did have a small skin abscess on the inside of the bellybutton with surrounding skin infection called cellulitis, and are being treated with course of  antibiotic doxycycline.  Return to the emergency department immediately for any new or worsening condition including worsening abdominal pain, fever, new or worsening trouble swallowing or trouble breathing, or any other symptoms concerning to you.  ___________________________________________   FINAL CLINICAL IMPRESSION(S) / ED DIAGNOSES   Final diagnoses:  Cellulitis of umbilicus  Abscess  Viral URI              Note: This dictation was prepared with Dragon dictation. Any transcriptional errors that result from this process are unintentional    Governor Rooks, MD 09/25/17 [phone]

## 2017-09-26 LAB — URINE CULTURE

## 2018-04-30 ENCOUNTER — Ambulatory Visit
Admission: EM | Admit: 2018-04-30 | Discharge: 2018-04-30 | Disposition: A | Discharge status: Home / Self Care | Payer: BLUE CROSS/BLUE SHIELD | Attending: Family Medicine | Admitting: Family Medicine

## 2018-04-30 ENCOUNTER — Other Ambulatory Visit: Payer: Self-pay

## 2018-04-30 DIAGNOSIS — B9789 Other viral agents as the cause of diseases classified elsewhere: Secondary | ICD-10-CM

## 2018-04-30 DIAGNOSIS — R0981 Nasal congestion: Secondary | ICD-10-CM | POA: Diagnosis not present

## 2018-04-30 DIAGNOSIS — J029 Acute pharyngitis, unspecified: Secondary | ICD-10-CM

## 2018-04-30 LAB — RAPID STREP SCREEN (MED CTR MEBANE ONLY): Streptococcus, Group A Screen (Direct): NEGATIVE

## 2018-04-30 MED ORDER — LIDOCAINE VISCOUS HCL 2 % MT SOLN: OROMUCOSAL | 0 refills | Status: DC

## 2018-04-30 NOTE — ED Triage Notes (Signed)
Pt with sore throat since Monday.  States she used to get strep a lot. Pain 4/10 with medication

## 2018-04-30 NOTE — ED Provider Notes (Signed)
[facility]    CSN: [id number] Arrival date & time: 04/30/18  0936     History   Chief Complaint Chief Complaint  Patient presents with  . Sore Throat    HPI Kristy Knight is a 22 y.o. female.   The history is provided by the patient.  Sore Throat   URI  Presenting symptoms: congestion, rhinorrhea and sore throat   Severity:  Moderate Onset quality:  Sudden Duration:  3 days Timing:  Constant Progression:  Unchanged Chronicity:  New Relieved by:  None tried Ineffective treatments:  None tried Associated symptoms: no sinus pain and no wheezing   Risk factors: sick contacts   Risk factors: not elderly, no chronic cardiac disease, no chronic kidney disease, no chronic respiratory disease, no diabetes mellitus, no immunosuppression, no recent illness and no recent travel     Past Medical History:  Diagnosis Date  . Anxiety     There are no active problems to display for this patient.   History reviewed. No pertinent surgical history.  OB History   None      Home Medications    Prior to Admission medications   Medication Sig Start Date End Date Taking? Authorizing Provider  doxycycline (VIBRAMYCIN) 100 MG capsule Take 1 capsule (100 mg total) by mouth 2 (two) times daily. 09/25/17   Governor Rooks, MD  lidocaine (XYLOCAINE) 2 % solution 20 ml gargle and spit q 6 hours prn 04/30/18   Payton Mccallum, MD  ondansetron (ZOFRAN ODT) 8 MG disintegrating tablet Take 1 tablet (8 mg total) by mouth every 8 (eight) hours as needed. Patient not taking: Reported on 06/03/2017 03/27/17   Payton Mccallum, MD  phenazopyridine (PYRIDIUM) 200 MG tablet Take 1 tablet (200 mg total) by mouth 3 (three) times daily. Patient not taking: Reported on 06/03/2017 11/04/15   Lutricia Feil, PA-C    Family History History reviewed. No pertinent family history.  Social History Social History   Tobacco Use  . Smoking status: Current Every Day Smoker    Packs/day: 1.00    Types: Cigarettes  . Smokeless tobacco: Never Used  Substance Use Topics  . Alcohol use: No  . Drug use: No     Allergies   Patient has no known allergies.   Review of Systems Review of Systems  HENT: Positive for congestion, rhinorrhea and sore throat. Negative for sinus pain.   Respiratory: Negative for wheezing.      Physical Exam Triage Vital Signs ED Triage Vitals  Enc Vitals Group     BP 04/30/18 1001 (!) 120/91     Pulse Rate 04/30/18 1001 98     Resp 04/30/18 1001 18     Temp 04/30/18 1001 99.1 F (37.3 C)     Temp Source 04/30/18 1001 Oral     SpO2 04/30/18 1001 99 %     Weight 04/30/18 0959 170 lb (77.1 kg)     Height 04/30/18 0959 5\' 3"  (1.6 m)     Head Circumference --      Peak Flow --      Pain Score 04/30/18 0959 4     Pain Loc --      Pain Edu? --      Excl. in GC? --    No data found.  Updated Vital Signs BP (!) 120/91 (BP Location: Right Arm)   Pulse 98   Temp 99.1 F (37.3 C) (Oral)   Resp 18   Ht 5\' 3"  (1.6  m)   Wt 170 lb (77.1 kg)   LMP 07/31/2017   SpO2 99%   BMI 30.11 kg/m   Visual Acuity Right Eye Distance:   Left Eye Distance:   Bilateral Distance:    Right Eye Near:   Left Eye Near:    Bilateral Near:     Physical Exam  Constitutional: She appears well-developed and well-nourished. No distress.  HENT:  Head: Normocephalic and atraumatic.  Right Ear: Tympanic membrane, external ear and ear canal normal.  Left Ear: Tympanic membrane, external ear and ear canal normal.  Nose: Rhinorrhea present. No mucosal edema, nose lacerations, sinus tenderness, nasal deformity, septal deviation or nasal septal hematoma. No epistaxis.  No foreign bodies. Right sinus exhibits no maxillary sinus tenderness and no frontal sinus tenderness. Left sinus exhibits no maxillary sinus tenderness and no frontal sinus tenderness.  Mouth/Throat: Uvula is midline and mucous membranes are normal. Posterior oropharyngeal erythema present. No  oropharyngeal exudate, posterior oropharyngeal edema or tonsillar abscesses. No tonsillar exudate.  Neck: Normal range of motion. Neck supple. No thyromegaly present.  Cardiovascular: Normal rate, regular rhythm and normal heart sounds.  Pulmonary/Chest: Effort normal and breath sounds normal. No stridor. No respiratory distress. She has no wheezes. She has no rales.  Lymphadenopathy:    She has no cervical adenopathy.  Skin: She is not diaphoretic.  Nursing note and vitals reviewed.    UC Treatments / Results  Labs (all labs ordered are listed, but only abnormal results are displayed) Labs Reviewed  RAPID STREP SCREEN ([facility] ONLY)  CULTURE, GROUP A STREP [facility])    EKG None  Radiology No results found.  Procedures Procedures (including critical care time)  Medications Ordered in UC Medications - No data to display  Initial Impression / Assessment and Plan / UC Course  I have reviewed the triage vital signs and the nursing notes.  Pertinent labs & imaging results that were available during my care of the patient were reviewed by me and considered in my medical decision making (see chart for details).      Final Clinical Impressions(s) / UC Diagnoses   Final diagnoses:  Sore throat  Viral pharyngitis   Discharge Instructions   None    ED Prescriptions    Medication Sig Dispense Auth. Provider   lidocaine (XYLOCAINE) 2 % solution 20 ml gargle and spit q 6 hours prn 100 mL Payton Mccallumonty, Danyia Borunda, MD     1. Lab results and diagnosis reviewed with patient 2. rx as per orders above; reviewed possible side effects, interactions, risks and benefits  3. Recommend supportive treatment with rest, fluids, otc analgesics prn  4. Follow-up prn if symptoms worsen or don't improve   Controlled Substance Prescriptions [place] Controlled Substance Registry consulted? Not Applicable   Payton Mccallumonty, Heidie Krall, MD 04/30/18 1105

## 2018-05-03 LAB — CULTURE, GROUP A STREP (THRC)

## 2019-01-09 DIAGNOSIS — H53143 Visual discomfort, bilateral: Secondary | ICD-10-CM | POA: Diagnosis not present

## 2019-10-17 DIAGNOSIS — Z20828 Contact with and (suspected) exposure to other viral communicable diseases: Secondary | ICD-10-CM | POA: Diagnosis not present

## 2019-12-27 DIAGNOSIS — Z20822 Contact with and (suspected) exposure to covid-19: Secondary | ICD-10-CM | POA: Diagnosis not present

## 2019-12-28 DIAGNOSIS — F603 Borderline personality disorder: Secondary | ICD-10-CM | POA: Diagnosis not present

## 2019-12-28 DIAGNOSIS — F1010 Alcohol abuse, uncomplicated: Secondary | ICD-10-CM | POA: Diagnosis not present

## 2020-01-15 NOTE — Progress Notes (Addendum)
Patient: Kristy Knight, Female    DOB: [birth year], 24 y.o.   MRN: [id number] Visit Date: 01/15/2020  Today's Provider: Jairo Ben, FNP   Chief Complaint  Patient presents with  . New Patient (Initial Visit)   Subjective:  Virtual Visit via Video Note  I connected with [name] on 01/15/20 at  8:00 AM EST by a video enabled telemedicine application and verified that I am speaking with the correct person using two identifiers.  Location: Patient: at home  Provider: Provider: Provider's office at  [facility], [place].      I discussed the limitations of evaluation and management by telemedicine and the availability of in person appointments. The patient expressed understanding and agreed to proceed.    I discussed the assessment and treatment plan with the patient. The patient was provided an opportunity to ask questions and all were answered. The patient agreed with the plan and demonstrated an understanding of the instructions.   The patient was advised to call back or seek an in-person evaluation if the symptoms worsen or if the condition fails to improve as anticipated.    New Patient Kristy Knight is a 24 y.o. female who presents today for health maintenance and establish care as a new patient. She feels fairly well. She reports exercising no. She reports she is sleeping fairly well.  She wants to establish PCP care.   She is on leave of absence - LOA needs paper work filled out.   She has history of anxiety and depression.  Denies any suicidal or homicidal ideations or plans at this time. She has been years ago.   She has been on previous psychiatric medications. Zoloft in the past and trazodone for sleep.   She has had a past history of alcohol abuse- she has been sober for the past two months. She wants to stay sober.   Denies any smoking.  Denies any drug use.  History of alcohol abuse- has been  sober x 2 months. Denies any alcohol or other drug use.   She has Aspergers. She does therapy at [facility] counseling- trying to do play therapy with [facility]  She has [facility] in [place]- it is with a Veterinary surgeon.  She has been missing work due to this for three weeks, She used PAL time needs excuse for 01/18/2020.Will bring papers for me to look over, will have psychiatry take over leave once she is in. Anxiety and Depression are affecting her daily life and causing her to miss work.   No LMP recorded. (Menstrual status: Irregular Periods). Denies any chance of pregnancy.  Patient  denies any fever, body aches,chills, rash, chest pain, shortness of breath, nausea, vomiting, or diarrhea.   She has no other concerns at today's video visit, she understands the risk of telephone/ video visit and does want to proceed.   -----------------------------------------------------------------     Review of Systems  Constitutional: Positive for fatigue. Negative for activity change, appetite change, chills, diaphoresis, fever and unexpected weight change.  HENT: Negative.   Eyes: Negative.   Respiratory: Negative.   Cardiovascular: Negative.   Gastrointestinal: Negative.   Endocrine: Negative.   Genitourinary: Negative.   Musculoskeletal: Negative.   Skin: Negative.   Neurological: Negative.   Hematological: Negative.   Psychiatric/Behavioral: Positive for behavioral problems, decreased concentration and sleep disturbance. Negative for agitation, confusion, dysphoric mood, hallucinations, self-injury and suicidal ideas. The patient is nervous/anxious. The patient is not hyperactive.  All other systems reviewed and are negative.   Social History She  reports that she has been smoking cigarettes. She has been smoking about 1.00 pack per day. She has never used smokeless tobacco. She reports that she does not drink alcohol or use drugs. Social History   Socioeconomic History  . Marital status:  Single    Spouse name: Not on file  . Number of children: Not on file  . Years of education: Not on file  . Highest education level: Not on file  Occupational History  . Not on file  Tobacco Use  . Smoking status: Current Every Day Smoker    Packs/day: 1.00    Types: Cigarettes  . Smokeless tobacco: Never Used  Substance and Sexual Activity  . Alcohol use: No  . Drug use: No  . Sexual activity: Not on file  Other Topics Concern  . Not on file  Social History Narrative  . Not on file   Social Determinants of Health   Financial Resource Strain:   . Difficulty of Paying Living Expenses: Not on file  Food Insecurity:   . Worried About Programme researcher, broadcasting/film/video in the Last Year: Not on file  . Ran Out of Food in the Last Year: Not on file  Transportation Needs:   . Lack of Transportation (Medical): Not on file  . Lack of Transportation (Non-Medical): Not on file  Physical Activity:   . Days of Exercise per Week: Not on file  . Minutes of Exercise per Session: Not on file  Stress:   . Feeling of Stress : Not on file  Social Connections:   . Frequency of Communication with Friends and Family: Not on file  . Frequency of Social Gatherings with Friends and Family: Not on file  . Attends Religious Services: Not on file  . Active Member of Clubs or Organizations: Not on file  . Attends Banker Meetings: Not on file  . Marital Status: Not on file    There are no problems to display for this patient.   No past surgical history on file.  Family History  Family Status  Relation Name Status  . Mother  Alive  . Father  Alive   Her family history is not on file.     No Known Allergies  Previous Medications   DOXYCYCLINE (VIBRAMYCIN) 100 MG CAPSULE    Take 1 capsule (100 mg total) by mouth 2 (two) times daily.   LIDOCAINE (XYLOCAINE) 2 % SOLUTION    20 ml gargle and spit q 6 hours prn   ONDANSETRON (ZOFRAN ODT) 8 MG DISINTEGRATING TABLET    Take 1 tablet (8 mg  total) by mouth every 8 (eight) hours as needed.   PHENAZOPYRIDINE (PYRIDIUM) 200 MG TABLET    Take 1 tablet (200 mg total) by mouth 3 (three) times daily.    Patient Care Team: Patient, No Pcp Per as PCP - General (General Practice)      Objective:   No vital signs available.Video visit only.    Patient is alert and oriented and responsive to questions Engages in conversation with provider. Speaks in full sentences without any pauses without any shortness of breath or distress. She does smile on video and is appreciative for care.   Physical Exam Constitutional:      Appearance: Normal appearance.  Neurological:     Mental Status: She is alert.  Psychiatric:        Mood and Affect: Mood normal.  Behavior: Behavior normal.        Thought Content: Thought content normal.        Judgment: Judgment normal.     Comments: Sister is also present for video visit.       Depression Screen No flowsheet data found. Depression screen PHQ 2/9 01/18/2020  Down, Depressed, Hopeless 2  PHQ - 2 Score 2  Change in appetite 3  Trouble concentrating 2    PHQ 9 sent to [facility] - result score of  Chl Phq-9 and GAD score as below. She denies any suicidal or homicidal ideations or intents, however she and her sister verbalize she has had in years past and they could see her going back in that path if she does not get psychiatric help soon.   Question 01/18/2020 8:52 AM EST - Filed by Patient  Patient Health Questionnaire-9   Over the past two weeks how often have you experienced little interest or pleasure in doing things? More than half the days  Over the past two weeks how often have you noticed yourself feeling down, depressed, or hopeless? More than half the days  Trouble falling or staying asleep, or sleeping too much Nearly every day  Feeling tired or having little energy More than half the days  Poor appetite or overeating Nearly every day  Feeling bad about yourself - or that  you are a failure or have let yourself or your family down Nearly every day  Trouble concentrating on things, such as reading the newspaper or watching television More than half the days  Moving or speaking so slowly that other people could have noticed. Or the opposite - being so fidgety or restless that you have been moving around a lot more than usual More than half the days  Thoughts that you would be better off dead, or of hurting yourself in some way Several days  Chl Phq Total (range: 0 - 27) 20 (major depression, severe)Critical   Acknowledgement: I acknowledge that my provider may not view the results until the time of my appointment  Chl Mychart Gad  Question 01/18/2020 8:54 AM EST - Filed by Patient  Over the last 2 weeks, how often have you been bothered by feeling nervous, anxious, or on edge? Nearly every day  Over the last 2 weeks, how often have you been bothered by not being able to stop or control worrying? Nearly every day  Over the last 2 weeks, how often have you been bothered by worrying too much about different things? Nearly every day`  Over the last 2 weeks, how often have you been bothered with trouble relaxing? Nearly every day  Over the last 2 weeks, how often have you been bothered by being so restless it's hard to sit still? Nearly every day  Over the last 2 weeks, how often have you been bothered with becoming easily annoyed or irritable? Nearly every day  Over the last 2 weeks, how often have you been bothered with feeling afraid as if something awful might happen? Nearly every day  How difficult has your anxiety made it for you to do your work, take care of things at home, or get along with other people?        Assessment & Plan:     Routine Health Maintenance and Physical Exam  Exercise Activities and Dietary recommendations Goals   None      There is no immunization history on file for this patient.  There are no preventive  care reminders to  display for this patient.  Yearly dental and eye exam recommended.  Discussed health benefits of physical activity, and encouraged her to engage in regular exercise appropriate for her age and condition.     Depression, major, single episode, severe (HCC) - Plan: CBC with Differential/Platelet, Comprehensive Metabolic Panel (CMET)  Severe anxiety - Plan: Ambulatory referral to Psychiatry  Asperger's syndrome- reported history  - Plan: Ambulatory referral to Psychiatry  History of risk factor for suicide - Plan: Ambulatory referral to Psychiatry  Vitamin D deficiency - Plan: VITAMIN D 25 Hydroxy (Vit-D Deficiency, Fractures)  Screening for thyroid disorder - Plan: TSH  Screening for cholesterol level - Plan: Lipid Panel w/o Chol/HDL Ratio   Orders Placed This Encounter  Procedures  . CBC with Differential/Platelet  . Comprehensive Metabolic Panel (CMET)  . VITAMIN D 25 Hydroxy (Vit-D Deficiency, Fractures)  . TSH    Standing Status:   Future    Standing Expiration Date:   02/15/2020  . Lipid Panel w/o Chol/HDL Ratio  . Ambulatory referral to Psychiatry    Referral Priority:   Urgent    Referral Type:   Psychiatric    Referral Reason:   Specialty Services Required    Requested Specialty:   Psychiatry    Number of Visits Requested:   1    Advised patient call the office or your primary care doctor for an appointment if no improvement within 72 hours or if any symptoms change or worsen at any time  Advised ER or urgent Care if after hours or on weekend. Call 911 for emergency symptoms at any time.Patinet verbalized understanding of all instructions given/reviewed and treatment plan and has no further questions or concerns at this time.    Marland KitchenResources given for suicide and when to reach out. Mental resources for health sent to [facility] patient received and responded. Urgent referral to psychiatry and counseling recommended.  She will bring LOA paper work and we can review and  possibly do and will have to have psychiatrist continue if the deem appropriate.  She will come for fasting labs within one week to have baseline.   Can consider SSRI, will call back to discuss after labs if she does not have a psychiatry appointment by then. Discussed Black Box warning for these medications and patient is higher risk given her history. She verbalizes understanding. She will seek care immediately if suicidal intents or homicidal intents/ ideations or plans occur. Discussed having accountability partner for mental health.   Smoking cessation instruction/counseling given:  counseled patient on the dangers of tobacco use, advised patient to stop smoking, and reviewed strategies to maximize success  --------------------------------------------------------------------  I provided 45  minutes of non-face-to-face time during this encounter.  There are other unrelated non-urgent complaints, but due to the busy schedule and the amount of time I've already spent with her, time does not permit me to address these routine issues at today's visit. I've requested another appointment to review these additional issues.  Will need to evaluate women's health care last PAP / immunizations at next in person visit. NEED RECORD RELEASE for previous care and history at next in person visit.   The entirety of the information documented in the History of Present Illness, Review of Systems and Physical Exam were personally obtained by me. Portions of this information were initially documented by the  Certified Medical Assistant whose name is documented in Epic and reviewed by me for thoroughness and accuracy.  I have personally  performed the exam and reviewed the chart and it is accurate to the best of my knowledge.  Haematologist has been used and any errors in dictation or transcription are unintentional.  Kelby Aline. [facility]

## 2020-01-18 ENCOUNTER — Encounter: Payer: Self-pay | Admitting: Adult Health

## 2020-01-18 ENCOUNTER — Ambulatory Visit (INDEPENDENT_AMBULATORY_CARE_PROVIDER_SITE_OTHER): Payer: BC Managed Care – PPO | Admitting: Adult Health

## 2020-01-18 DIAGNOSIS — Z9189 Other specified personal risk factors, not elsewhere classified: Secondary | ICD-10-CM

## 2020-01-18 DIAGNOSIS — F419 Anxiety disorder, unspecified: Secondary | ICD-10-CM | POA: Diagnosis not present

## 2020-01-18 DIAGNOSIS — Z0000 Encounter for general adult medical examination without abnormal findings: Secondary | ICD-10-CM | POA: Diagnosis not present

## 2020-01-18 DIAGNOSIS — F322 Major depressive disorder, single episode, severe without psychotic features: Secondary | ICD-10-CM

## 2020-01-18 DIAGNOSIS — F845 Asperger's syndrome: Secondary | ICD-10-CM | POA: Diagnosis not present

## 2020-01-18 DIAGNOSIS — E559 Vitamin D deficiency, unspecified: Secondary | ICD-10-CM | POA: Insufficient documentation

## 2020-01-18 DIAGNOSIS — F840 Autistic disorder: Secondary | ICD-10-CM | POA: Insufficient documentation

## 2020-01-18 DIAGNOSIS — F17200 Nicotine dependence, unspecified, uncomplicated: Secondary | ICD-10-CM

## 2020-01-18 DIAGNOSIS — Z1329 Encounter for screening for other suspected endocrine disorder: Secondary | ICD-10-CM

## 2020-01-18 DIAGNOSIS — Z13220 Encounter for screening for lipoid disorders: Secondary | ICD-10-CM

## 2020-01-19 ENCOUNTER — Encounter: Payer: Self-pay | Admitting: Adult Health

## 2020-01-19 DIAGNOSIS — Z13220 Encounter for screening for lipoid disorders: Secondary | ICD-10-CM | POA: Diagnosis not present

## 2020-01-19 DIAGNOSIS — R748 Abnormal levels of other serum enzymes: Secondary | ICD-10-CM | POA: Diagnosis not present

## 2020-01-19 DIAGNOSIS — E559 Vitamin D deficiency, unspecified: Secondary | ICD-10-CM | POA: Diagnosis not present

## 2020-01-19 DIAGNOSIS — F322 Major depressive disorder, single episode, severe without psychotic features: Secondary | ICD-10-CM | POA: Diagnosis not present

## 2020-01-20 ENCOUNTER — Encounter: Payer: Self-pay | Admitting: Adult Health

## 2020-01-20 LAB — LIPID PANEL W/O CHOL/HDL RATIO
Cholesterol, Total: 186 mg/dL (ref 100–199)
HDL: 60 mg/dL (ref >39)
LDL Chol Calc (NIH): 113 mg/dL — ABNORMAL HIGH (ref 0–99)
Triglycerides: 70 mg/dL (ref 0–149)
VLDL Cholesterol Cal: 13 mg/dL (ref 5–40)

## 2020-01-20 LAB — CBC WITH DIFFERENTIAL/PLATELET
Basophils Absolute: 0.1 10*3/uL (ref 0.0–0.2)
Basos: 1 %
EOS (ABSOLUTE): 0.2 10*3/uL (ref 0.0–0.4)
Eos: 3 %
Hematocrit: 39.3 % (ref 34.0–46.6)
Hemoglobin: 13.1 g/dL (ref 11.1–15.9)
Immature Grans (Abs): 0 10*3/uL (ref 0.0–0.1)
Immature Granulocytes: 0 %
Lymphocytes Absolute: 2.2 10*3/uL (ref 0.7–3.1)
Lymphs: 40 %
MCH: 29.8 pg (ref 26.6–33.0)
MCHC: 33.3 g/dL (ref 31.5–35.7)
MCV: 90 fL (ref 79–97)
Monocytes Absolute: 0.4 10*3/uL (ref 0.1–0.9)
Monocytes: 8 %
Neutrophils Absolute: 2.6 10*3/uL (ref 1.4–7.0)
Neutrophils: 48 %
Platelets: 135 10*3/uL — ABNORMAL LOW (ref 150–450)
RBC: 4.39 x10E6/uL (ref 3.77–5.28)
RDW: 11.4 % — ABNORMAL LOW (ref 11.7–15.4)
WBC: 5.5 10*3/uL (ref 3.4–10.8)

## 2020-01-20 LAB — COMPREHENSIVE METABOLIC PANEL
ALT: 83 IU/L — ABNORMAL HIGH (ref 0–32)
AST: 55 IU/L — ABNORMAL HIGH (ref 0–40)
Albumin/Globulin Ratio: 2.1 (ref 1.2–2.2)
Albumin: 4.5 g/dL (ref 3.9–5.0)
Alkaline Phosphatase: 70 IU/L (ref 39–117)
BUN/Creatinine Ratio: 16 (ref 9–23)
BUN: 14 mg/dL (ref 6–20)
Bilirubin Total: 0.9 mg/dL (ref 0.0–1.2)
CO2: 21 mmol/L (ref 20–29)
Calcium: 10 mg/dL (ref 8.7–10.2)
Chloride: 104 mmol/L (ref 96–106)
Creatinine, Ser: 0.85 mg/dL (ref 0.57–1.00)
GFR calc Af Amer: 111 mL/min/{1.73_m2} (ref >59)
GFR calc non Af Amer: 96 mL/min/{1.73_m2} (ref >59)
Globulin, Total: 2.1 g/dL (ref 1.5–4.5)
Glucose: 89 mg/dL (ref 65–99)
Potassium: 4.2 mmol/L (ref 3.5–5.2)
Sodium: 140 mmol/L (ref 134–144)
Total Protein: 6.6 g/dL (ref 6.0–8.5)

## 2020-01-20 LAB — VITAMIN D 25 HYDROXY (VIT D DEFICIENCY, FRACTURES): Vit D, 25-Hydroxy: 12.5 ng/mL — ABNORMAL LOW (ref 30.0–100.0)

## 2020-01-22 LAB — TSH: TSH: 1.14 u[IU]/mL (ref 0.450–4.500)

## 2020-01-22 LAB — HEPATITIS PANEL, ACUTE
Hep A IgM: NEGATIVE
Hep B C IgM: NEGATIVE
Hep C Virus Ab: <0.1 s/co ratio (ref 0.0–0.9)
Hepatitis B Surface Ag: NEGATIVE

## 2020-01-22 LAB — SPECIMEN STATUS REPORT

## 2020-01-22 NOTE — Progress Notes (Signed)
(   CBC and CMP in one month) Vitamin D level recheck in 3 months.  Thyroid level is normal.  CBC has no signs of infection, platelets are decreased, this can be caused by alcohol use, any other symptoms patient is currently having that were not reported at last visits ? Medication use ? Recent viral infections ? Excessive bruising ? We will recheck CBC in one month. NO NSAIDS such as aleve, ibuprofen etc.  AST and ALT liver enzymes are increased, suspect this is from history of alcohol use as well, since patient has discontinued alcohol we will recheck CMP in one month.   Vitamin D is low will send Vitamin D at fifty thousand international units to take one tablet one time per week - pick the same day for 10 weeks. Recheck level in 3 months. Start Vitamin D 3 at 4,000 four thousand international units daily after stopping the prescription Vitamin D in 10 weeks. LDL bad cholesterol mild elevation. Watch diet, increased exercise.

## 2020-01-29 ENCOUNTER — Other Ambulatory Visit: Payer: Self-pay

## 2020-01-29 ENCOUNTER — Encounter: Payer: Self-pay | Admitting: Adult Health

## 2020-01-29 ENCOUNTER — Ambulatory Visit: Payer: BC Managed Care – PPO | Admitting: Adult Health

## 2020-01-29 VITALS — BP 124/78 | HR 95 | Temp 97.2°F | Wt 182.2 lb

## 2020-01-29 DIAGNOSIS — F419 Anxiety disorder, unspecified: Secondary | ICD-10-CM

## 2020-01-29 DIAGNOSIS — E559 Vitamin D deficiency, unspecified: Secondary | ICD-10-CM

## 2020-01-29 DIAGNOSIS — F329 Major depressive disorder, single episode, unspecified: Secondary | ICD-10-CM

## 2020-01-29 DIAGNOSIS — E785 Hyperlipidemia, unspecified: Secondary | ICD-10-CM

## 2020-01-29 DIAGNOSIS — F32A Depression, unspecified: Secondary | ICD-10-CM

## 2020-01-29 DIAGNOSIS — D696 Thrombocytopenia, unspecified: Secondary | ICD-10-CM | POA: Diagnosis not present

## 2020-01-29 DIAGNOSIS — H6123 Impacted cerumen, bilateral: Secondary | ICD-10-CM | POA: Diagnosis not present

## 2020-01-29 DIAGNOSIS — F322 Major depressive disorder, single episode, severe without psychotic features: Secondary | ICD-10-CM

## 2020-01-29 MED ORDER — VITAMIN D (ERGOCALCIFEROL) 1.25 MG (50000 UNIT) PO CAPS: 50000.0000 [IU] | ORAL_CAPSULE | ORAL | 0 refills | Status: DC

## 2020-01-29 NOTE — Progress Notes (Signed)
Patient: Kristy Knight Female    DOB: [birth year]   24 y.o.   MRN: [id number] Visit Date: 01/29/2020  Today's Provider: Marcille Buffy, FNP   Chief Complaint  Patient presents with  . Follow-up   Subjective:     HPI  Patient returns to office for follow up visit to discuss lab results- she viewed results and message on my chart however was unable to reach caller she reports.  Her sister accompanies her to visit.   Patient is doing well, she is appreciative that she has an appointment with psychiatry on 02/04/2020. Denies any changes from last visit which was virtual.  She has history of Asperger's, severe anxiety and depression.  She has seen psychiatry in the past see last note. She has been suicidal in the past. She has no intent. She has good support with her sister.   She has mild decrease in platelets and mildly elevated liver enzymes, She has history of alcohol abuse in past and has been sober for over two months reported.  She denies any Tylenol use or over use. She denies any abdominal pain. She denies any unusual bruises or rashes.   Patient  denies any fever, body aches,chills, rash, chest pain, shortness of breath, nausea, vomiting, or diarrhea.   Patient's last menstrual period was 01/15/2020 (approximate).   No Known Allergies  No current outpatient medications on file.  Review of Systems  Constitutional: Positive for activity change (decreased ability to work due to anxiety / depression. On leave at present. Works at [employer] ).  HENT: Negative for ear pain (no pain/ has ear fullness. mild decrease in hearing. ).   Respiratory: Negative.   Cardiovascular: Negative.   Gastrointestinal: Negative.   Genitourinary: Negative for difficulty urinating.  Musculoskeletal: Negative.   Skin: Negative.   Neurological: Negative.   Psychiatric/Behavioral: Positive for decreased concentration. Negative for confusion, dysphoric mood and suicidal ideas. The  patient is nervous/anxious and is hyperactive.     Social History   Tobacco Use  . Smoking status: Current Every Day Smoker    Packs/day: 1.00    Types: Cigarettes  . Smokeless tobacco: Never Used  Substance Use Topics  . Alcohol use: No      Objective:   Today's Vitals   01/29/20 1503  BP: 124/78  Pulse: 95  Temp: (!) 97.2 F (36.2 C)  TempSrc: Temporal  SpO2: 98%  Weight: 182 lb 3.2 oz (82.6 kg)   Body mass index is 32.28 kg/m.  Some of the exam was limited due to patient preference. She is nervous with her left leg nervously moving during most of visit. Her and her sister are very pleasant and appreciative of the care and this is new for patient. Physical Exam Vitals reviewed.  Constitutional:      General: She is not in acute distress.    Appearance: Normal appearance. She is not ill-appearing, toxic-appearing or diaphoretic.     Comments: Patient moves on and off of exam table and in room without difficulty. Gait is normal in hall and in room. Patient is oriented to person place time and situation. Patient answers questions appropriately and engages in conversation.   HENT:     Head: Normocephalic and atraumatic.     Right Ear: There is impacted cerumen (bilateral impacted hard cerumen unable to see tympanic membrane/ no pain with exam, visualized portion of ear canals appear normal. ).     Left Ear: There is impacted  cerumen.     Nose: Nose normal. No congestion or rhinorrhea.     Mouth/Throat:     Mouth: Mucous membranes are moist.     Pharynx: Oropharynx is clear. No oropharyngeal exudate or posterior oropharyngeal erythema.  Eyes:     General: No scleral icterus.       Right eye: No discharge.        Left eye: No discharge.     Extraocular Movements: Extraocular movements intact.     Conjunctiva/sclera: Conjunctivae normal.     Pupils: Pupils are equal, round, and reactive to light.  Cardiovascular:     Rate and Rhythm: Normal rate and regular rhythm.      Pulses: Normal pulses.     Heart sounds: Normal heart sounds. No murmur. No friction rub. No gallop.   Pulmonary:     Effort: Pulmonary effort is normal.     Breath sounds: Normal breath sounds.  Abdominal:     Comments: Deferred/ declined by patient, she has never had PCP and due to anxiety would like to wait on exam.   Genitourinary:    Comments: Deferred/ declined by patient, she has never had PCP and due to anxiety would like to wait on exam.  Musculoskeletal:        General: Normal range of motion.     Cervical back: Normal range of motion and neck supple. No rigidity or tenderness.     Comments: Limited exam- patient preference   Lymphadenopathy:     Cervical: No cervical adenopathy.  Skin:    General: Skin is warm and dry.     Capillary Refill: Capillary refill takes less than 2 seconds.  Neurological:     General: No focal deficit present.     Mental Status: She is alert and oriented to person, place, and time.     Motor: No weakness.     Gait: Gait normal.  Psychiatric:        Attention and Perception: Attention normal. She is attentive. She does not perceive visual hallucinations.        Mood and Affect: Mood is anxious.        Speech: Speech is delayed (only at times ).        Behavior: Behavior is hyperactive. Behavior is cooperative.        Thought Content: Thought content normal.        Cognition and Memory: Cognition normal.        Judgment: Judgment normal.      No results found for any visits on 01/29/20.     Assessment & Plan    Vitamin D deficiency - Plan: VITAMIN D 25 Hydroxy (Vit-D Deficiency, Fractures)  Hyperlipidemia, unspecified hyperlipidemia type - Plan: Comprehensive Metabolic Panel (CMET)  Thrombocytopenia (HCC) - Plan: CBC with Differential/Platelet  Bilateral impacted cerumen  Depression, major, single episode, severe (HCC)  Anxiety and depression Keep psychiatry appointment 02/04/20 and she has resources for walk in clinics sent to  [facility] previously if needed.   Start Vitamin D - level was 12.5. recheck following labs below after completing 10 weeks of Vitamin D therapy.  Meds ordered this encounter  Medications  . Vitamin D, Ergocalciferol, (DRISDOL) 1.25 MG (50000 UNIT) CAPS capsule    Sig: Take 1 capsule (50,000 Units total) by mouth every 7 (seven) days. (taking one tablet per week)    Dispense:  10 capsule    Refill:  0   Labs when completed the Vitamin D prescription.  Orders Placed This Encounter  Procedures  . CBC with Differential/Platelet  . Comprehensive Metabolic Panel (CMET)  . VITAMIN D 25 Hydroxy (Vit-D Deficiency, Fractures)   Smoking cessation is recommended she is however not ready to quit. She has recently quit alcohol and will try one thing at a time. Health risks discussed.    Return in about 3 months (around 04/30/2020), or if symptoms worsen or fail to improve, for at any time for any worsening symptoms.  They are aware they can reach out to the office should they have questions or concerns.   Advised patient call the office or your primary care doctor for an appointment if no improvement within 72 hours or if any symptoms change or worsen at any time  Advised ER or urgent Care if after hours or on weekend. Call 911 for emergency symptoms at any time.Patinet verbalized understanding of all instructions given/reviewed and treatment plan and has no further questions or concerns at this time.      The entirety of the information documented in the History of Present Illness, Review of Systems and Physical Exam were personally obtained by me. Portions of this information were initially documented by the  Certified Medical Assistant whose name is documented in Epic and reviewed by me for thoroughness and accuracy.  I have personally performed the exam and reviewed the chart and it is accurate to the best of my knowledge.  Museum/gallery conservator has been used and any errors in dictation or transcription  are unintentional.  Eula Fried. Flinchum FNP-C  [facility]   Jairo Ben, FNP  [facility]

## 2020-01-29 NOTE — Patient Instructions (Addendum)
Debrox - Carbamide Peroxide ear solution What is this medicine? CARBAMIDE PEROXIDE (CAR bah mide per OX ide) is used to soften and help remove ear wax. This medicine may be used for other purposes; ask your health care provider or pharmacist if you have questions. COMMON BRAND NAME(S): Auro Ear, Auro Earache Relief, Debrox, Ear Drops, Ear Wax Removal, Ear Wax Remover, Earwax Treatment, Murine, Thera-Ear What should I tell my health care provider before I take this medicine? They need to know if you have any of these conditions:  dizziness  ear discharge  ear pain, irritation or rash  infection  perforated eardrum (hole in eardrum)  an unusual or allergic reaction to carbamide peroxide, glycerin, hydrogen peroxide, other medicines, foods, dyes, or preservatives  pregnant or trying to get pregnant  breast-feeding How should I use this medicine? This medicine is only for use in the outer ear canal. Follow the directions carefully. Wash hands before and after use. The solution may be warmed by holding the bottle in the hand for 1 to 2 minutes. Lie with the affected ear facing upward. Place the proper number of drops into the ear canal. After the drops are instilled, remain lying with the affected ear upward for 5 minutes to help the drops stay in the ear canal. A cotton ball may be gently inserted at the ear opening for no longer than 5 to 10 minutes to ensure retention. Repeat, if necessary, for the opposite ear. Do not touch the tip of the dropper to the ear, fingertips, or other surface. Do not rinse the dropper after use. Keep container tightly closed. Talk to your pediatrician regarding the use of this medicine in children. While this drug may be used in children as young as 12 years for selected conditions, precautions do apply. Overdosage: If you think you have taken too much of this medicine contact a poison control center or emergency room at once. NOTE: This medicine is only for you.  Do not share this medicine with others. What if I miss a dose? If you miss a dose, use it as soon as you can. If it is almost time for your next dose, use only that dose. Do not use double or extra doses. What may interact with this medicine? Interactions are not expected. Do not use any other ear products without asking your doctor or health care professional. This list may not describe all possible interactions. Give your health care provider a list of all the medicines, herbs, non-prescription drugs, or dietary supplements you use. Also tell them if you smoke, drink alcohol, or use illegal drugs. Some items may interact with your medicine. What should I watch for while using this medicine? This medicine is not for long-term use. Do not use for more than 4 days without checking with your health care professional. Contact your doctor or health care professional if your condition does not start to get better within a few days or if you notice burning, redness, itching or swelling. What side effects may I notice from receiving this medicine? Side effects that you should report to your doctor or health care professional as soon as possible:  allergic reactions like skin rash, itching or hives, swelling of the face, lips, or tongue  burning, itching, and redness  worsening ear pain  rash Side effects that usually do not require medical attention (report to your doctor or health care professional if they continue or are bothersome):  abnormal sensation while putting the drops in the  ear  temporary reduction in hearing (but not complete loss of hearing) This list may not describe all possible side effects. Call your doctor for medical advice about side effects. You may report side effects to FDA at 1-800-FDA-1088. Where should I keep my medicine? Keep out of the reach of children. Store at room temperature between 15 and 30 degrees C (59 and 86 degrees F) in a tight, light-resistant container.  Keep bottle away from excessive heat and direct sunlight. Throw away any unused medicine after the expiration date. NOTE: This sheet is a summary. It may not cover all possible information. If you have questions about this medicine, talk to your doctor, pharmacist, or health care provider.  2020 Elsevier/Gold Standard (2008-02-24 14:00:02) Thrombocytopenia Thrombocytopenia means that you have a low number of platelets in your blood. Platelets are tiny cells in the blood. When you bleed, they clump together at the cut or injury to stop the bleeding. This is called blood clotting. If you do not have enough platelets, it can cause bleeding problems. Some cases of this condition are mild while others are more severe. What are the causes? This condition may be caused by:  Your body not making enough platelets. This may be caused by: ? Your bone marrow not making blood cells (aplastic anemia). ? Cancer in the bone marrow. ? Certain medicines. ? Infection in the bone marrow. ? Drinking a lot of alcohol.  Your body destroying platelets too quickly. This may be caused by: ? Certain immune diseases. ? Certain medicines. Certain blood clotting disorders. Vitamin D Deficiency Vitamin D deficiency is when your body does not have enough vitamin D. Vitamin D is important to your body because: It helps your body use other minerals. It helps to keep your bones strong and healthy. It may help to prevent some diseases. It helps your heart and other muscles work well. Not getting enough vitamin D can make your bones soft. It can also cause other health problems. What are the causes? This condition may be caused by: Not eating enough foods that contain vitamin D. Not getting enough sun. Having diseases that make it hard for your body to absorb vitamin D. Having a surgery in which a part of the stomach or a part of the small intestine is removed. Having kidney disease or liver disease. What increases the  risk? You are more likely to get this condition if: You are older. You do not spend much time outdoors. You live in a nursing home. You have had broken bones. You have weak or thin bones (osteoporosis). You have a disease or condition that changes how your body absorbs vitamin D. You have dark skin. You take certain medicines. You are overweight or obese. What are the signs or symptoms? In mild cases, there may not be any symptoms. If the condition is very bad, symptoms may include: Bone pain. Muscle pain. Falling often. Broken bones caused by a minor injury. How is this treated? Treatment may include taking supplements as told by your doctor. Your doctor will tell you what dose is best for you. Supplements may include: Vitamin D. Calcium. Follow these instructions at home: Eating and drinking  Eat foods that contain vitamin D, such as: Dairy products, cereals, or juices with added vitamin D. Check the label. Fish, such as salmon or trout. Eggs. Oysters. Mushrooms. The items listed above may not be a complete list of what you can eat and drink. Contact a dietitian for more options. General instructions  Take medicines and supplements only as told by your doctor. Get regular, safe exposure to natural sunlight. Do not use a tanning bed. Maintain a healthy weight. Lose weight if needed. Keep all follow-up visits as told by your doctor. This is important. How is this prevented? You can get vitamin D by: Eating foods that naturally contain vitamin D. Eating or drinking products that have vitamin D added to them, such as cereals, juices, and milk. Taking vitamin D or a multivitamin that contains vitamin D. Being in the sun. Your body makes vitamin D when your skin is exposed to sunlight. Your body changes the sunlight into a form of the vitamin that it can use. Contact a doctor if: Your symptoms do not go away. You feel sick to your stomach (nauseous). You throw up  (vomit). You poop less often than normal, or you have trouble pooping (constipation). Summary Vitamin D deficiency is when your body does not have enough vitamin D. Vitamin D helps to keep your bones strong and healthy. This condition is often treated by taking a supplement. Your doctor will tell you what dose is best for you. This information is not intended to replace advice given to you by your health care provider. Make sure you discuss any questions you have with your health care provider. Document Revised: 07/21/2018 Document Reviewed: 07/21/2018 Elsevier Patient Education  [employer]. ?  ? Certain disorders that are passed from parent to child (inherited). ? Certain bleeding disorders. ? Pregnancy. ? Having a spleen that is larger than normal. What are the signs or symptoms?  Bleeding that is not normal.  Nosebleeds.  Heavy menstrual periods.  Blood in the pee (urine) or poop (stool).  A purple-like color to the skin (purpura).  Bruising.  A rash that looks like pinpoint, purple-red spots (petechiae). How is this treated?  Treatment of another condition that is causing the low platelet count.  Medicines to help protect your platelets from being destroyed.  A replacement (transfusion) of platelets to stop or prevent bleeding.  Surgery to remove the spleen. Follow these instructions at home: Activity  Avoid activities that could cause you to get hurt or bruised. Follow instructions about how to prevent falls.  Take care not to cut yourself: ? When you shave. ? When you use scissors, needles, knives, or other tools.  Take care not to burn yourself: ? When you use an iron. ? When you cook. General instructions   Check your skin and the inside of your mouth for bruises or blood as told by your doctor.  Check to see if there is blood in your spit (sputum), pee, and poop. Do this as told by your doctor.  Do not drink alcohol.  Take over-the-counter  and prescription medicines only as told by your doctor.  Do not take any medicines that have aspirin or NSAIDs in them. These medicines can thin your blood and cause you to bleed.  Tell all of your doctors that you have this condition. Be sure to tell your dentist and eye doctor too. Contact a doctor if:  You have bruises and you do not know why. Get help right away if:  You are bleeding anywhere on your body.  You have blood in your spit, pee, or poop. Summary  Thrombocytopenia means that you have a low number of platelets in your blood.  Platelets are needed for blood clotting.  Symptoms of this condition include bleeding that is not normal, and bruising.  Take  care not to cut or burn yourself. This information is not intended to replace advice given to you by your health care provider. Make sure you discuss any questions you have with your health care provider. Document Revised: 08/14/2018 Document Reviewed: 08/14/2018 Elsevier Patient Education  2020 [employer].

## 2020-02-04 ENCOUNTER — Other Ambulatory Visit: Payer: Self-pay

## 2020-02-04 ENCOUNTER — Encounter: Payer: Self-pay | Admitting: Psychiatry

## 2020-02-04 ENCOUNTER — Ambulatory Visit (INDEPENDENT_AMBULATORY_CARE_PROVIDER_SITE_OTHER): Payer: BC Managed Care – PPO | Admitting: Psychiatry

## 2020-02-04 DIAGNOSIS — F840 Autistic disorder: Secondary | ICD-10-CM

## 2020-02-04 DIAGNOSIS — F331 Major depressive disorder, recurrent, moderate: Secondary | ICD-10-CM | POA: Diagnosis not present

## 2020-02-04 DIAGNOSIS — F1011 Alcohol abuse, in remission: Secondary | ICD-10-CM | POA: Diagnosis not present

## 2020-02-04 DIAGNOSIS — F419 Anxiety disorder, unspecified: Secondary | ICD-10-CM

## 2020-02-04 MED ORDER — SERTRALINE HCL 50 MG PO TABS: 50.0000 mg | ORAL_TABLET | Freq: Every day | ORAL | 1 refills | Status: DC

## 2020-02-04 MED ORDER — HYDROXYZINE HCL 25 MG PO TABS
25.0000 mg | ORAL_TABLET | Freq: Two times a day (BID) | ORAL | 0 refills | Status: DC | PRN
Start: 2020-02-04 — End: 2020-03-09
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
## 2020-03-07 ENCOUNTER — Telehealth: Payer: Self-pay

## 2020-03-07 NOTE — Telephone Encounter (Signed)
Medication management - Telephone message left for pt that she should still have a refill of her Sertraline at her [facility] pharmacy but if needed a new hydroxyzine order to please call back with this request.

## 2020-03-09 ENCOUNTER — Telehealth (HOSPITAL_COMMUNITY): Payer: Self-pay

## 2020-03-09 DIAGNOSIS — F419 Anxiety disorder, unspecified: Secondary | ICD-10-CM

## 2020-03-09 MED ORDER — HYDROXYZINE HCL 25 MG PO TABS: 25.0000 mg | ORAL_TABLET | Freq: Two times a day (BID) | ORAL | 0 refills | Status: DC | PRN

## 2020-03-09 NOTE — Telephone Encounter (Signed)
Patient called regarding her Hydroxyzine 25mg . She stated that she's out of it because she had to take it consistently until the Sertraline started to work. She also stated that she's doesn't think it's very effective for her and is not sure if she wants to switch to something else she can take on a regular basis or increase the dosage on her Sertraline. She has a scheduled appointment on 03/15/20. Please review and advise. Thank you.

## 2020-03-09 NOTE — Telephone Encounter (Signed)
Okay, I have sent the refill for hydroxyzine to her pharmacy.  If she wants to discuss other options you can offer an earlier appointment at 10 AM tomorrow morning, April 15.

## 2020-03-15 ENCOUNTER — Telehealth: Payer: BC Managed Care – PPO | Admitting: Psychiatry

## 2020-03-24 ENCOUNTER — Encounter: Payer: Self-pay | Admitting: Psychiatry

## 2020-03-24 ENCOUNTER — Other Ambulatory Visit: Payer: Self-pay

## 2020-03-24 ENCOUNTER — Telehealth (INDEPENDENT_AMBULATORY_CARE_PROVIDER_SITE_OTHER): Payer: BC Managed Care – PPO | Admitting: Psychiatry

## 2020-03-24 DIAGNOSIS — F840 Autistic disorder: Secondary | ICD-10-CM | POA: Diagnosis not present

## 2020-03-24 DIAGNOSIS — F3341 Major depressive disorder, recurrent, in partial remission: Secondary | ICD-10-CM

## 2020-03-24 DIAGNOSIS — F1011 Alcohol abuse, in remission: Secondary | ICD-10-CM

## 2020-03-24 DIAGNOSIS — F419 Anxiety disorder, unspecified: Secondary | ICD-10-CM | POA: Diagnosis not present

## 2020-03-24 MED ORDER — SERTRALINE HCL 100 MG PO TABS: 100.0000 mg | ORAL_TABLET | Freq: Every day | ORAL | 1 refills | Status: DC

## 2020-03-24 MED ORDER — HYDROXYZINE HCL 25 MG PO TABS: 25.0000 mg | ORAL_TABLET | Freq: Two times a day (BID) | ORAL | 0 refills | Status: DC | PRN

## 2020-03-24 NOTE — Progress Notes (Signed)
BH MD/PA/NP OP Progress Note  I connected with  Kristy Knight on 03/24/20 by a video enabled telemedicine application and verified that I am speaking with the correct person using two identifiers.   I discussed the limitations of evaluation and management by telemedicine. The patient expressed understanding and agreed to proceed.    03/24/2020 10:46 AM Kristy Knight  MRN:  [id number]  Chief Complaint:  " I am doing better I guess."  HPI: Patient was seen about 6 weeks ago and was started on Zoloft based on her good response to the medicine in the past.  She was also prescribed hydroxyzine 25 mg twice daily as needed for anxiety.  Today, patient reported that she feels her depression symptoms have improved.  She stated that her sister feels she is doing better.  She is coming out of her room more and interacting more with her family.  She did however state that she is used to working night shifts and therefore has reverted back to sleeping during the day and staying up late at night.  She reported that she found hydroxyzine to be too sedating so she only takes half tablet twice a day and has been using it almost daily regularly.  She did state that she noticed significant improvement in her anxiety a few weeks after she started taking Zoloft.  She denied any suicidal ideations. Regarding work, she stated that her boss wants her to come back and has been checking on her regularly.  She stated that she knows there have been a lot of changes with a lot of new employees.  She mentioned that a few of the old employees have left and couple of them have retired.  She anticipates that is going to be a little stressful in the beginning.  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  She stated that she does not think she is completely ready at this point and will take some time to think about going back. Regarding  alcohol consumption, patient stated that she has stayed away from alcohol and has not relapsed since we last spoke.   Visit Diagnosis:    ICD-10-CM   1. MDD (major depressive disorder), recurrent, in partial remission (HCC)  F33.41   2. Anxiety  F41.9   3. Autism spectrum disorder  F84.0   4. Alcohol use disorder, mild, in early remission  F10.11     Past Psychiatric History: MDD, anxiety, autism spectrum disorder, alcohol use disorder  Past Medical History:  Past Medical History:  Diagnosis Date  . Anxiety    No past surgical history on file.  Family Psychiatric History: Sister-history of depression, currently on Celexa, Lamictal, BuSpar  Family History: No family history on file.  Social History:  Social History   Socioeconomic History  . Marital status: Single    Spouse name: Not on file  . Number of children: Not on file  . Years of education: Not on file  . Highest education level: Not on file  Occupational History  . Not on file  Tobacco Use  . Smoking status: Current Every Day Smoker    Packs/day: 1.00    Types: Cigarettes  . Smokeless tobacco: Never Used  Substance and Sexual Activity  . Alcohol use: No  . Drug use: No  . Sexual activity: Not on file  Other Topics Concern  . Not on  file  Social History Narrative  . Not on file   Social Determinants of Health   Financial Resource Strain:   . Difficulty of Paying Living Expenses:   Food Insecurity:   . Worried About Programme researcher, broadcasting/film/video in the Last Year:   . Barista in the Last Year:   Transportation Needs:   . Freight forwarder (Medical):   Marland Kitchen Lack of Transportation (Non-Medical):   Physical Activity:   . Days of Exercise per Week:   . Minutes of Exercise per Session:   Stress:   . Feeling of Stress :   Social Connections:   . Frequency of Communication with Friends and Family:   . Frequency of Social Gatherings with Friends and Family:   . Attends Religious Services:   . Active  Member of Clubs or Organizations:   . Attends Banker Meetings:   Marland Kitchen Marital Status:     Allergies: No Known Allergies  Metabolic Disorder Labs: No results found for: HGBA1C, MPG No results found for: PROLACTIN Lab Results  Component Value Date   CHOL 186 01/19/2020   TRIG 70 01/19/2020   HDL 60 01/19/2020   LDLCALC 113 (H) 01/19/2020   Lab Results  Component Value Date   TSH 1.140 01/19/2020    Therapeutic Level Labs: No results found for: LITHIUM No results found for: VALPROATE No components found for:  CBMZ  Current Medications: Current Outpatient Medications  Medication Sig Dispense Refill  . hydrOXYzine (ATARAX/VISTARIL) 25 MG tablet Take 1 tablet (25 mg total) by mouth 2 (two) times daily as needed for anxiety. 30 tablet 0  . sertraline (ZOLOFT) 50 MG tablet Take 1 tablet (50 mg total) by mouth daily. 30 tablet 1  . Vitamin D, Ergocalciferol, (DRISDOL) 1.25 MG (50000 UNIT) CAPS capsule Take 1 capsule (50,000 Units total) by mouth every 7 (seven) days. (taking one tablet per week) 10 capsule 0   No current facility-administered medications for this visit.        Psychiatric Specialty Exam: Review of Systems  There were no vitals taken for this visit.There is no height or weight on file to calculate BMI.  General Appearance: Fairly groomed  Eye Contact:  Good  Speech:  Clear and Coherent and Normal Rate  Volume:  Normal  Mood:  Euthymic  Affect: Restricted  Thought Process:  Goal Directed, Linear and Descriptions of Associations: Intact  Orientation:  Full (Time, Place, and Person)  Thought Content: Logical   Suicidal Thoughts:  No  Homicidal Thoughts:  No  Memory:  Recent;   Good Remote;   Good  Judgement: Fair  Insight: Fair  Psychomotor Activity:  Normal  Concentration:  Concentration: Good and Attention Span: Good  Recall:  Good  Fund of Knowledge: Good  Language: Good  Akathisia:  Negative  Handed:  Right  AIMS (if indicated): not  done  Assets:  Communication Skills Desire for Improvement Financial Resources/Insurance Housing  ADL's:  Intact  Cognition: WNL  Sleep: Fair, is used to sleeping during the day and staying up at night due to working night shifts.     Screenings: GAD-7     Patient Message from 01/18/2020 in [facility]  Total GAD-7 Score  21    PHQ2-9     Patient Message from 01/18/2020 in [facility] Total Score  2  (Pended)        Assessment and Plan: Patient appears to be doing better after starting  Zoloft.  She reported improvement in her depression and anxiety symptoms.  She has been taking hydroxyzine 12.5 mg twice a day almost regularly every day.  She stated that she thinks she can benefit by increasing the dose of Zoloft.  She was agreeable to increasing the dose of Zoloft 200 mg for optimal effect.  1. MDD (major depressive disorder), recurrent, in partial remission (HCC)  -Increase sertraline (ZOLOFT) 100 MG tablet; Take 1 tablet (100 mg total) by mouth daily.  Dispense: 30 tablet; Refill: 1  2. Anxiety  -Continue hydrOXYzine (ATARAX/VISTARIL) 25 MG tablet; Take 1 tablet (25 mg total) by mouth 2 (two) times daily as needed for anxiety.  Dispense: 30 tablet; Refill: 0 -Increase sertraline (ZOLOFT) 100 MG tablet; Take 1 tablet (100 mg total) by mouth daily.  Dispense: 30 tablet; Refill: 1  3. Autism spectrum disorder   4. Alcohol use disorder, mild, in early remission -Encouraged to abstain from alcohol use.   Follow-up in 2 months.    [place] Crane, MD 03/24/2020, 10:46 AM

## 2020-04-11 ENCOUNTER — Encounter: Payer: Self-pay | Admitting: Adult Health

## 2020-04-11 ENCOUNTER — Ambulatory Visit (INDEPENDENT_AMBULATORY_CARE_PROVIDER_SITE_OTHER): Payer: BC Managed Care – PPO | Admitting: Adult Health

## 2020-04-11 ENCOUNTER — Other Ambulatory Visit: Payer: Self-pay

## 2020-04-11 VITALS — BP 118/78 | HR 81 | Temp 97.3°F | Wt 166.8 lb

## 2020-04-11 DIAGNOSIS — L819 Disorder of pigmentation, unspecified: Secondary | ICD-10-CM

## 2020-04-11 DIAGNOSIS — F17200 Nicotine dependence, unspecified, uncomplicated: Secondary | ICD-10-CM

## 2020-04-11 DIAGNOSIS — L989 Disorder of the skin and subcutaneous tissue, unspecified: Secondary | ICD-10-CM | POA: Diagnosis not present

## 2020-04-11 DIAGNOSIS — F419 Anxiety disorder, unspecified: Secondary | ICD-10-CM

## 2020-04-11 DIAGNOSIS — D485 Neoplasm of uncertain behavior of skin: Secondary | ICD-10-CM | POA: Diagnosis not present

## 2020-04-11 DIAGNOSIS — J301 Allergic rhinitis due to pollen: Secondary | ICD-10-CM | POA: Diagnosis not present

## 2020-04-11 DIAGNOSIS — L700 Acne vulgaris: Secondary | ICD-10-CM | POA: Diagnosis not present

## 2020-04-11 DIAGNOSIS — D696 Thrombocytopenia, unspecified: Secondary | ICD-10-CM

## 2020-04-11 DIAGNOSIS — F32A Depression, unspecified: Secondary | ICD-10-CM

## 2020-04-11 DIAGNOSIS — L821 Other seborrheic keratosis: Secondary | ICD-10-CM | POA: Diagnosis not present

## 2020-04-11 DIAGNOSIS — F845 Asperger's syndrome: Secondary | ICD-10-CM

## 2020-04-11 DIAGNOSIS — F329 Major depressive disorder, single episode, unspecified: Secondary | ICD-10-CM

## 2020-04-11 MED ORDER — DOXYCYCLINE HYCLATE 100 MG PO TABS: 100.0000 mg | ORAL_TABLET | Freq: Two times a day (BID) | ORAL | 0 refills | Status: DC

## 2020-04-11 MED ORDER — FLUTICASONE PROPIONATE 50 MCG/ACT NA SUSP
2.0000 | Freq: Every day | NASAL | 6 refills | Status: DC
Start: 2020-04-11 — End: 2021-01-03

## 2020-04-11 NOTE — Patient Instructions (Addendum)
After finishing prescription vitamin D start the next week Vitamin D 3 over the counter at 4,000 international units daily until recheck lab work that is already ordered. Call if not heard from dermatology in 2 weeks.     Mole A mole is a colored (pigmented) growth on the skin. Moles are very common. They are usually harmless, but some moles can become cancerous over time. What are the causes? Moles are caused when pigmented skin cells grow together in clusters instead of spreading out in the skin as they normally do. The reason why the skin cells grow together in clusters is not known. What increases the risk? You are more likely to develop a mole if you:  Have family members who have moles.  Are white.  Have blond hair.  Are often outdoors and exposed to the sun.  Received phototherapy when you were a newborn baby.  Are female. What are the signs or symptoms? A mole may be:  [place] or black.  Flat or raised.  Smooth or wrinkled. How is this diagnosed? A mole is diagnosed with a skin exam. If your health care provider thinks a mole may be cancerous, all or part of the mole will be removed for testing (biopsy). How is this treated? Most moles are noncancerous (benign) and do not require treatment. If a mole is found to be cancerous, it will be removed. You may also choose to have a mole removed if it is causing pain or if you do not like the way it looks. Follow these instructions at home: General instructions   Every month, look for new moles and check your existing moles for changes. This is important because a change in a mole can mean that the mole has become cancerous.  ABCDE changes in a mole indicate that you should be evaluated by your health care provider. ABCDE stands for: ? Asymmetry. This means the mole has an irregular shape. It is not round or oval. ? Border. This means the mole has an irregular or bumpy border. ? Color. This means the mole has multiple colors  in it, including brown, black, blue, red, or tan. Note that it is normal for moles to get darker when a woman is pregnant or takes birth control pills. ? Diameter. This means the mole is more than 0.2 inches (6 mm) across. ? Evolving. This refers to any unusual changes or symptoms in the mole, such as pain, itching, stinging, sensitivity, or bleeding.  If you have a large number of moles, see a skin doctor (dermatologist) at least one time every year for a full-body skin check. Lifestyle   When you are outdoors, wear sunscreen with SPF 30 (sun protection factor 30) or higher.  Use an adequate amount of sunscreen to cover exposed areas of skin. Put it on 30 minutes before you go out. Reapply it every 2 hours or anytime you come out of the water.  When you are out in the sun, wear a broad-brimmed hat and clothing that covers your arms and legs. Wear wraparound sunglasses. Contact a health care provider if:  The size, shape, borders, or color of your mole changes.  Your mole, or the skin near the mole, becomes painful, sore, red, or swollen.  Your mole: ? Develops more than one color. ? Itches or bleeds. ? Becomes scaly, sheds skin, or oozes fluid. ? Becomes flat or develops raised areas. ? Becomes hard or soft.  You develop a new mole. Summary  A mole  is a colored (pigmented) growth on the skin. Moles are very common. They are usually harmless, but some moles can become cancerous over time.  Every month, look for new moles and check your existing moles for changes. This is important because a change in a mole can mean that the mole has become cancerous.  If you have a large number of moles, see a skin doctor (dermatologist) at least one time every year for a full-body skin check.  When you are outdoors, wear sunscreen with SPF 30 (sun protection factor 30) or higher. Reapply it every 2 hours or anytime you come out of the water.  Contact a health care provider if you notice changes  in a mole or if you develop a new mole. This information is not intended to replace advice given to you by your health care provider. Make sure you discuss any questions you have with your health care provider. Document Revised: 06/18/2019 Document Reviewed: 04/08/2018 Elsevier Patient Education  [place].  Actinic Keratosis An actinic keratosis is a precancerous growth on the skin. If there is more than one growth, the condition is called actinic keratoses. Actinic keratoses appear most often on areas of skin that get a lot of sun exposure, including the scalp, face, ears, lips, upper back, forearms, and the backs of the hands. If left untreated, these growths may develop into a skin cancer called squamous cell carcinoma. It is important to have all these growths checked by a health care provider to determine the best treatment approach. What are the causes? Actinic keratoses are caused by getting too much ultraviolet (UV) radiation from the sun or other UV light sources. What increases the risk? You are more likely to develop this condition if you:  Have light-colored skin and blue eyes.  Have blond or red hair.  Spend a lot of time in the sun.  Do not protect your skin from the sun when outdoors.  Are an older person. The risk of developing an actinic keratosis increases with age. What are the signs or symptoms? Actinic keratoses feel like scaly, rough spots of skin. Symptoms of this condition include growths that may:  Be as small as a pinhead or as big as a quarter.  Itch, hurt, or feel sensitive.  Be skin-colored, light tan, dark tan, pink, or a combination of any of these colors. In most cases, the growths become red.  Have a small piece of pink or gray skin (skin tag) growing from them. It may be easier to notice actinic keratoses by feeling them, rather than seeing them. Sometimes, actinic keratoses disappear, but many reappear a few days to a few weeks later. How  is this diagnosed? This condition is usually diagnosed with a physical exam.  A tissue sample may be removed from the actinic keratosis and examined under a microscope (biopsy). How is this treated? If needed, this condition may be treated by:  Scraping off the actinic keratosis (curettage).  Freezing the actinic keratosis with liquid nitrogen (cryosurgery). This causes the growth to eventually fall off the skin.  Applying medicated creams or gels to destroy the cells in the growth.  Applying chemicals to the actinic keratosis to make the outer layers of skin peel off (chemical peel).  Using photodynamic therapy. In this procedure, medicated cream is applied to the actinic keratosis. This cream increases your skin's sensitivity to light. Then, a strong light is aimed at the actinic keratosis to destroy cells in the growth. Follow these  instructions at home: Skin care  Apply cool, wet cloths (cool compresses) to the affected areas.  Do not scratch your skin.  Check your skin regularly for any growths, especially growths that: ? Start to itch or bleed. ? Change in size, shape, or color. Caring for the treated area  Keep the treated area clean and dry as told by your health care provider.  Do not apply any medicine, cream, or lotion to the treated area unless your health care provider tells you to do that.  Do not pick at blisters or try to break them open. This can cause infection and scarring.  If you have red or irritated skin after treatment, follow instructions from your health care provider about how to take care of the treated area. Make sure you: ? Wash your hands with soap and water before you change your bandage (dressing). If soap and water are not available, use hand sanitizer. ? Change your dressing as told by your health care provider.  If you have red or irritated skin after treatment, check your treated area every day for signs of infection. Check for: ? Redness,  swelling, or pain. ? Fluid or blood. ? Warmth. ? Pus or a bad smell. General instructions  Take or apply over-the-counter and prescription medicines only as told by your health care provider.  Return to your normal activities as told by your health care provider. Ask your health care provider what activities are safe for you.  Have a skin exam done every year by a health care provider who is a skin specialist (dermatologist).  Keep all follow-up visits as told by your health care provider. This is important. Lifestyle  Do not use any products that contain nicotine or tobacco, such as cigarettes and e-cigarettes. If you need help quitting, ask your health care provider.  Take steps to protect your skin from the sun. ? Try to avoid the sun between 10:00 a.m. and 4:00 p.m. This is when the UV light is the strongest. ? Use a sunscreen or sunblock with SPF 30 (sun protection factor 30) or greater. ? Apply sunscreen before you are exposed to sunlight and reapply as often as directed by the instructions on the sunscreen container. ? Always wear sunglasses that have UV protection, and always wear a hat and clothing to protect your skin from sunlight. ? When possible, avoid medicines that increase your sensitivity to sunlight. ? Do not use tanning beds or other indoor tanning devices. Contact a health care provider if:  You notice any changes or new growths on your skin.  You have swelling, pain, or more redness around your treated area.  You have fluid or blood coming from your treated area.  Your treated area feels warm to the touch.  You have pus or a bad smell coming from your treated area.  You have a fever.  You have a blister that becomes large and painful. Summary  An actinic keratosis is a precancerous growth on the skin. If there is more than one growth, the condition is called actinic keratoses. In some cases, if left untreated, these growths can develop into skin  cancer.  Check your skin regularly for any growths, especially growths that start to itch or bleed, or change in size, shape, or color.  Take steps to protect your skin from the sun.  Contact a health care provider if you notice any changes or new growths on your skin.  Keep all follow-up visits as told by your  health care provider. This is important. This information is not intended to replace advice given to you by your health care provider. Make sure you discuss any questions you have with your health care provider. Document Revised: 03/25/2018 Document Reviewed: 03/25/2018 Elsevier Patient Education  [place].   Fluticasone nasal spray What is this medicine? FLUTICASONE (floo TIK a sone) is a corticosteroid. This medicine is used to treat the symptoms of allergies like sneezing, itchy red eyes, and itchy, runny, or stuffy nose. This medicine is also used to treat nasal polyps. This medicine may be used for other purposes; ask your health care provider or pharmacist if you have questions. COMMON BRAND NAME(S): ClariSpray, Flonase, Flonase Allergy Relief, Flonase Sensimist, Veramyst, XHANCE What should I tell my health care provider before I take this medicine? They need to know if you have any of these conditions:  eye disease, vision problems  infection, like tuberculosis, herpes, or fungal infection  recent surgery on nose or sinuses  taking a corticosteroid by mouth  an unusual or allergic reaction to fluticasone, steroids, other medicines, foods, dyes, or preservatives  pregnant or trying to get pregnant  breast-feeding How should I use this medicine? This medicine is for use in the nose. Follow the directions on your product or prescription label. This medicine works best if used at regular intervals. Do not use more often than directed. Make sure that you are using your nasal spray correctly. After 6 months of daily use for allergies, talk to your doctor or  health care professional before using it for a longer time. Ask your doctor or health care professional if you have any questions. Talk to your pediatrician regarding the use of this medicine in children. Special care may be needed. Some products have been used for allergies in children as young as 2 years. After 2 months of daily use without a prescription in a child, talk to your pediatrician before using it for a longer time. Use of this medicine for nasal polyps is not approved in children. Overdosage: If you think you have taken too much of this medicine contact a poison control center or emergency room at once. NOTE: This medicine is only for you. Do not share this medicine with others. What if I miss a dose? If you miss a dose, use it as soon as you remember. If it is almost time for your next dose, use only that dose and continue with your regular schedule. Do not use double or extra doses. What may interact with this medicine?  certain antibiotics like clarithromycin and telithromycin  certain medicines for fungal infections like ketoconazole, itraconazole, and voriconazole  conivaptan  nefazodone  some medicines for HIV  vaccines This list may not describe all possible interactions. Give your health care provider a list of all the medicines, herbs, non-prescription drugs, or dietary supplements you use. Also tell them if you smoke, drink alcohol, or use illegal drugs. Some items may interact with your medicine. What should I watch for while using this medicine? Visit your healthcare professional for regular checks on your progress. Tell your healthcare professional if your symptoms do not start to get better or if they get worse. This medicine may increase your risk of getting an infection. Tell your doctor or health care professional if you are around anyone with measles or chickenpox, or if you develop sores or blisters that do not heal properly. What side effects may I notice from  receiving this medicine? Side effects  that you should report to your doctor or health care professional as soon as possible:  allergic reactions like skin rash, itching or hives, swelling of the face, lips, or tongue  changes in vision  crusting or sores in the nose  nosebleed  signs and symptoms of infection like fever or chills; cough; sore throat  white patches or sores in the mouth or nose Side effects that usually do not require medical attention (report to your doctor or health care professional if they continue or are bothersome):  burning or irritation inside the nose or throat  changes in taste or smell  cough  headache This list may not describe all possible side effects. Call your doctor for medical advice about side effects. You may report side effects to FDA at 1-800-FDA-1088. Where should I keep my medicine? Keep out of the reach of children. Store at room temperature between 15 and 30 degrees C (59 and 86 degrees F). Avoid exposure to extreme heat, cold, or light. Throw away any unused medicine after the expiration date. NOTE: This sheet is a summary. It may not cover all possible information. If you have questions about this medicine, talk to your doctor, pharmacist, or health care provider.  2020 Elsevier/Gold Standard (2017-12-05 14:10:08) Allergic Rhinitis, Adult Allergic rhinitis is a reaction to allergens in the air. Allergens are tiny specks (particles) in the air that cause your body to have an allergic reaction. This condition cannot be passed from person to person (is not contagious). Allergic rhinitis cannot be cured, but it can be controlled. There are two types of allergic rhinitis:  Seasonal. This type is also called hay fever. It happens only during certain times of the year.  Perennial. This type can happen at any time of the year. What are the causes? This condition may be caused by:  Pollen from grasses, trees, and weeds.  House dust  mites.  Pet dander.  Mold. What are the signs or symptoms? Symptoms of this condition include:  Sneezing.  Runny or stuffy nose (nasal congestion).  A lot of mucus in the back of the throat (postnasal drip).  Itchy nose.  Tearing of the eyes.  Trouble sleeping.  Being sleepy during day. How is this treated? There is no cure for this condition. You should avoid things that trigger your symptoms (allergens). Treatment can help to relieve symptoms. This may include:  Medicines that block allergy symptoms, such as antihistamines. These may be given as a shot, nasal spray, or pill.  Shots that are given until your body becomes less sensitive to the allergen (desensitization).  Stronger medicines, if all other treatments have not worked. Follow these instructions at home: Avoiding allergens   Find out what you are allergic to. Common allergens include smoke, dust, and pollen.  Avoid them if you can. These are some of the things that you can do to avoid allergens: ? Replace carpet with wood, tile, or vinyl flooring. Carpet can trap dander and dust. ? Clean any mold found in the home. ? Do not smoke. Do not allow smoking in your home. ? Change your heating and air conditioning filter at least once a month. ? During allergy season:  Keep windows closed as much as you can. If possible, use air conditioning when there is a lot of pollen in the air.  Use a special filter for allergies with your furnace and air conditioner.  Plan outdoor activities when pollen counts are lowest. This is usually during the early  morning or evening hours.  If you do go outdoors when pollen count is high, wear a special mask for people with allergies.  When you come indoors, take a shower and change your clothes before sitting on furniture or bedding. General instructions  Do not use fans in your home.  Do not hang clothes outside to dry.  Wear sunglasses to keep pollen out of your  eyes.  Wash your hands right away after you touch household pets.  Take over-the-counter and prescription medicines only as told by your doctor.  Keep all follow-up visits as told by your doctor. This is important. Contact a doctor if:  You have a fever.  You have a cough that does not go away (is persistent).  You start to make whistling sounds when you breathe (wheeze).  Your symptoms do not get better with treatment.  You have thick fluid coming from your nose.  You start to have nosebleeds. Get help right away if:  Your tongue or your lips are swollen.  You have trouble breathing.  You feel dizzy or you feel like you are going to pass out (faint).  You have cold sweats. Summary  Allergic rhinitis is a reaction to allergens in the air.  This condition may be caused by allergens. These include pollen, dust mites, pet dander, and mold.  Symptoms include a runny, itchy nose, sneezing, or tearing eyes. You may also have trouble sleeping or feel sleepy during the day.  Treatment includes taking medicines and avoiding allergens. You may also get shots or take stronger medicines.  Get help if you have a fever or a cough that does not stop. Get help right away if you are short of breath. This information is not intended to replace advice given to you by your health care provider. Make sure you discuss any questions you have with your health care provider. Document Revised: 03/03/2019 Document Reviewed: 06/03/2018 Elsevier Patient Education  [place]. Doxycycline tablets or capsules What is this medicine? DOXYCYCLINE (dox i SYE kleen) is a tetracycline antibiotic. It kills certain bacteria or stops their growth. It is used to treat many kinds of infections, like dental, skin, respiratory, and urinary tract infections. It also treats acne, Lyme disease, malaria, and certain sexually transmitted infections. This medicine may be used for other purposes; ask your health  care provider or pharmacist if you have questions. COMMON BRAND NAME(S): Acticlate, Adoxa, Adoxa CK, Adoxa Pak, Adoxa TT, Alodox, Avidoxy, Doxal, LYMEPAK, Mondoxyne NL, Monodox, Morgidox 1x, Morgidox 1x Kit, Morgidox 2x, Morgidox 2x Kit, NutriDox, Ocudox, [place], [place], Vibra-Tabs, Vibramycin What should I tell my health care provider before I take this medicine? They need to know if you have any of these conditions:  liver disease  long exposure to sunlight like working outdoors  stomach problems like colitis  an unusual or allergic reaction to doxycycline, tetracycline antibiotics, other medicines, foods, dyes, or preservatives  pregnant or trying to get pregnant  breast-feeding How should I use this medicine? Take this medicine by mouth with a full glass of water. Follow the directions on the prescription label. It is best to take this medicine without food, but if it upsets your stomach take it with food. Take your medicine at regular intervals. Do not take your medicine more often than directed. Take all of your medicine as directed even if you think you are better. Do not skip doses or stop your medicine early. Talk to your pediatrician regarding the use of this medicine  in children. While this drug may be prescribed for selected conditions, precautions do apply. Overdosage: If you think you have taken too much of this medicine contact a poison control center or emergency room at once. NOTE: This medicine is only for you. Do not share this medicine with others. What if I miss a dose? If you miss a dose, take it as soon as you can. If it is almost time for your next dose, take only that dose. Do not take double or extra doses. What may interact with this medicine?  antacids  barbiturates  birth control pills  bismuth subsalicylate  carbamazepine  methoxyflurane  other antibiotics  phenytoin  vitamins that contain iron  warfarin This list may not describe all  possible interactions. Give your health care provider a list of all the medicines, herbs, non-prescription drugs, or dietary supplements you use. Also tell them if you smoke, drink alcohol, or use illegal drugs. Some items may interact with your medicine. What should I watch for while using this medicine? Tell your doctor or health care professional if your symptoms do not improve. Do not treat diarrhea with over the counter products. Contact your doctor if you have diarrhea that lasts more than 2 days or if it is severe and watery. Do not take this medicine just before going to bed. It may not dissolve properly when you lay down and can cause pain in your throat. Drink plenty of fluids while taking this medicine to also help reduce irritation in your throat. This medicine can make you more sensitive to the sun. Keep out of the sun. If you cannot avoid being in the sun, wear protective clothing and use sunscreen. Do not use sun lamps or tanning beds/booths. Birth control pills may not work properly while you are taking this medicine. Talk to your doctor about using an extra method of birth control. If you are being treated for a sexually transmitted infection, avoid sexual contact until you have finished your treatment. Your sexual partner may also need treatment. Avoid antacids, aluminum, calcium, magnesium, and iron products for 4 hours before and 2 hours after taking a dose of this medicine. If you are using this medicine to prevent malaria, you should still protect yourself from contact with mosquitos. Stay in screened-in areas, use mosquito nets, keep your body covered, and use an insect repellent. What side effects may I notice from receiving this medicine? Side effects that you should report to your doctor or health care professional as soon as possible:  allergic reactions like skin rash, itching or hives, swelling of the face, lips, or tongue  difficulty breathing  fever  itching in the  rectal or genital area  pain on swallowing  rash, fever, and swollen lymph nodes  redness, blistering, peeling or loosening of the skin, including inside the mouth  severe stomach pain or cramps  unusual bleeding or bruising  unusually weak or tired  yellowing of the eyes or skin Side effects that usually do not require medical attention (report to your doctor or health care professional if they continue or are bothersome):  diarrhea  loss of appetite  nausea, vomiting This list may not describe all possible side effects. Call your doctor for medical advice about side effects. You may report side effects to FDA at 1-800-FDA-1088. Where should I keep my medicine? Keep out of the reach of children. Store at room temperature, below 30 degrees C (86 degrees F). Protect from light. Keep container tightly closed. Throw  away any unused medicine after the expiration date. Taking this medicine after the expiration date can make you seriously ill. NOTE: This sheet is a summary. It may not cover all possible information. If you have questions about this medicine, talk to your doctor, pharmacist, or health care provider.  2020 Elsevier/Gold Standard (2019-02-12 13:44:53)  Acne  Acne is a skin problem that causes small, red bumps (pimples) and other skin changes. The skin has tiny holes called pores. Each pore has an oil gland. Acne happens when the pores get blocked. The pores may become red, sore, and swollen. They may also become infected. Acne is common among teenagers. Acne usually goes away over time. What are the causes? This condition may be caused when:  Oil glands get blocked by oil, dead skin cells, and dirt.  Bacteria that live in the oil glands increase in number and cause infection. Acne can start with changes in hormones. These changes can occur:  When children mature into their teens (adolescence).  When women get their period (menstrual cycle).  When women are  pregnant. Some things can make acne worse. They include:  Cosmetics and hair products that have oil in them.  Stress.  Diseases that cause changes in hormones.  Some medicines.  Headbands, backpacks, or shoulder pads.  Being near certain oils and chemicals.  Foods that are high in sugars. These include dairy products, sweets, and chocolates. What increases the risk? You are more likely to develop this condition if:  You are a teenager.  You have a family history of acne. What are the signs or symptoms? Symptoms of this condition include:  Small, red bumps (pimples or papules).  Whiteheads.  Blackheads.  Small, pus-filled pimples (pustules).  Big, red pimples or pustules that feel tender. Acne that is very bad can cause:  An abscess. This is an area that has pus.  Cysts. These are hard, painful sacs that have fluid.  Scars. These can happen after large pimples heal. How is this treated? Treatment for this condition depends on how bad your acne is. It may include:  Creams and lotions. These can: ? Keep the pores of your skin open. ? Prevent infections and swelling.  Medicines that treat infections (antibiotics). These can be put on your skin or taken as pills.  Pills that decrease the amount of oil in your skin.  Birth control pills.  Light or laser treatments.  Shots of medicine into the areas with acne.  Chemicals that cause the skin to peel.  Surgery. Follow these instructions at home: Good skin care is the most important thing you can do to treat your acne. Take care of your skin as told by your doctor. You may be told to do these things:  Wash your skin gently at least two times each day. You should also wash your skin: ? After you exercise. ? Before you go to bed.  Use mild soap.  Use a water-based skin moisturizer after you wash your skin.  Use a sunscreen or sunblock with SPF 30 or greater. This is very important if you are using acne  medicines.  Choose cosmetics that will not block your oil glands (are noncomedogenic). Medicines  Take over-the-counter and prescription medicines only as told by your doctor.  If you were prescribed an antibiotic medicine, use it or take it as told by your doctor. Do not stop using the antibiotic even if your acne gets better. General instructions  Keep your hair clean and off  your face. Shampoo your hair on a regular basis. If you have oily hair, you may need to wash it every day.  Avoid wearing tight headbands or hats.  Avoid picking or squeezing your pimples. That can make your acne worse and cause it to scar.  Shave gently. Only shave when you have to.  Keep a food journal. This can help you see if any foods are linked to your acne.  Keep all follow-up visits as told by your doctor. This is important. Contact a doctor if:  Your acne is not better after eight weeks.  Your acne gets worse.  You have a large area of skin that is red or tender.  You think that you are having side effects from any acne medicine. Summary  Acne is a skin problem that causes pimples. Acne is common among teenagers. Acne usually goes away over time.  Acne starts with changes in your hormones. Other causes include stress, diet, and some medicines.  Follow your doctor's instructions on how to take care of your skin. Good skin care is the most important thing you can do to treat your acne.  Take over-the-counter and prescription medicines only as told by your doctor.  Contact your doctor if you think that you are having side effects from any acne medicine. This information is not intended to replace advice given to you by your health care provider. Make sure you discuss any questions you have with your health care provider. Document Revised: 03/25/2018 Document Reviewed: 03/25/2018 Elsevier Patient Education  [employer].

## 2020-04-11 NOTE — Progress Notes (Signed)
Established patient visit   Patient: Kristy Knight   DOB: 1996-07-29   24 y.o. Female  MRN: [id number] Visit Date: 04/11/2020  Today's healthcare provider: Marcille Buffy, FNP   Chief Complaint  Patient presents with  . Anxiety  I,Porsha C McClurkin,acting as a scribe for [employer], FNP.,have documented all relevant documentation on the behalf of Marcille Buffy, FNP,as directed by  Marcille Buffy, FNP while in the presence of Marcille Buffy, [place].  Subjective    HPI Anxiety, Follow-up  She was last seen for anxiety 2 months ago. Changes made at last visit include patient see psychiatry.    She reports good compliance with treatment. She reports good tolerance of treatment. She is not having side effects.   She feels her anxiety is mild and Improved since last visit. Patient reports she needs a release to work note. Symptoms: No chest pain No difficulty concentrating  No dizziness No fatigue  No feelings of losing control No insomnia  No irritable No palpitations  No panic attacks No racing thoughts  No shortness of breath No sweating  No tremors/shakes    GAD-7 Results GAD-7 Generalized Anxiety Disorder Screening Tool 01/18/2020  1. Feeling Nervous, Anxious, or on Edge 3  2. Not Being Able to Stop or Control Worrying 3  3. Worrying Too Much About Different Things 3  4. Trouble Relaxing 3  5. Being So Restless it's Hard To Sit Still 3  6. Becoming Easily Annoyed or Irritable 3  7. Feeling Afraid As If Something Awful Might Happen 3  Total GAD-7 Score 21  Difficulty At Work, Home, or Getting  Along With Others? Extremely difficult    PHQ-9 Scores PHQ9 SCORE ONLY 01/18/2020  PHQ-9 Total Score 7    Anxiety note  :  Anxiety -Continue hydrOXYzine (ATARAX/VISTARIL) 25 MG tablet; Take 1 tablet (25 mg total) by mouth 2 (two) times daily as needed for anxiety.  Dispense: 30 tablet; Refill: 0 -Increase sertraline  (ZOLOFT) 100 MG tablet; Take 1 tablet (100 mg total) by mouth daily.  Dispense: 30 tablet; Refill:  4. Alcohol use disorder, mild, in early remission -Encouraged to abstain from alcohol use. Follow-up in 2 months. [place] Crane, MD 03/24/2020, 10:46 AM  She feels she is doing so much better with her anxiety/ depression, she wants to go back to work without restrictions and is seeing psychiatry who is in agreement.  She denies suicidal or homicidal ideations or intents.  She reports no alcohol. She has follow up end of June with psychiatry.    Acne on left side of face from mask wear, inflamed with erythema, she does report tendency to pick at her skin.she is using benzyl peroxide wash .   Mole on her left upper back she reports was flat and had changes. Has been present for years. Denies any bleeding. Does have crusting skin.   Patient  denies any fever, body aches,chills, rash, chest pain, shortness of breath, nausea, vomiting, or diarrhea.  Denies dizziness, lightheadedness, pre syncopal or syncopal episodes.   No Known Allergies  ---------------------------------------------------------------------------------------------------   Patient Active Problem List   Diagnosis Date Noted  . Skin lesion of back 04/11/2020  . Atypical pigmented skin lesion 04/11/2020  . Acne vulgaris 04/11/2020  . Seasonal allergic rhinitis due to pollen 04/11/2020  . MDD (major depressive disorder), recurrent episode, moderate ([place]) 02/04/2020  . Alcohol use disorder, mild, in early remission 02/04/2020  . Vitamin D deficiency 01/18/2020  .  History of risk factor for suicide 01/18/2020  . Autism spectrum disorder 01/18/2020  . Anxiety 01/18/2020  . Depression, major, single episode, severe ([facility]) 01/18/2020  . Screening for thyroid disorder 01/18/2020  . Smoker 01/18/2020   Past Medical History:  Diagnosis Date  . Anxiety    History reviewed. No pertinent surgical history. Social History    Tobacco Use  . Smoking status: Current Every Day Smoker    Packs/day: 1.00    Types: Cigarettes  . Smokeless tobacco: Never Used  Substance Use Topics  . Alcohol use: No  . Drug use: No   Social History   Socioeconomic History  . Marital status: Single    Spouse name: Not on file  . Number of children: Not on file  . Years of education: Not on file  . Highest education level: Not on file  Occupational History  . Not on file  Tobacco Use  . Smoking status: Current Every Day Smoker    Packs/day: 1.00    Types: Cigarettes  . Smokeless tobacco: Never Used  Substance and Sexual Activity  . Alcohol use: No  . Drug use: No  . Sexual activity: Not on file  Other Topics Concern  . Not on file  Social History Narrative  . Not on file   Social Determinants of Health   Financial Resource Strain:   . Difficulty of Paying Living Expenses:   Food Insecurity:   . Worried About Programme researcher, broadcasting/film/video in the Last Year:   . Barista in the Last Year:   Transportation Needs:   . Freight forwarder (Medical):   Marland Kitchen Lack of Transportation (Non-Medical):   Physical Activity:   . Days of Exercise per Week:   . Minutes of Exercise per Session:   Stress:   . Feeling of Stress :   Social Connections:   . Frequency of Communication with Friends and Family:   . Frequency of Social Gatherings with Friends and Family:   . Attends Religious Services:   . Active Member of Clubs or Organizations:   . Attends Banker Meetings:   Marland Kitchen Marital Status:   Intimate Partner Violence:   . Fear of Current or Ex-Partner:   . Emotionally Abused:   Marland Kitchen Physically Abused:   . Sexually Abused:    History reviewed. No pertinent family history. No Known Allergies     Medications: Outpatient Medications Prior to Visit  Medication Sig  . hydrOXYzine (ATARAX/VISTARIL) 25 MG tablet Take 1 tablet (25 mg total) by mouth 2 (two) times daily as needed for anxiety.  . sertraline (ZOLOFT)  100 MG tablet Take 1 tablet (100 mg total) by mouth daily.  . Vitamin D, Ergocalciferol, (DRISDOL) 1.25 MG (50000 UNIT) CAPS capsule Take 1 capsule (50,000 Units total) by mouth every 7 (seven) days. (taking one tablet per week)   No facility-administered medications prior to visit.    Review of Systems  Constitutional: Negative.   Respiratory: Negative.   Hematological: Negative.   Psychiatric/Behavioral: Negative for agitation, behavioral problems, decreased concentration, self-injury, sleep disturbance and suicidal ideas. The patient is not nervous/anxious.     Last CBC Lab Results  Component Value Date   WBC 5.5 01/19/2020   HGB 13.1 01/19/2020   HCT 39.3 01/19/2020   MCV 90 01/19/2020   MCH 29.8 01/19/2020   RDW 11.4 (L) 01/19/2020   PLT 135 (L) 01/19/2020   Last metabolic panel Lab Results  Component Value Date  GLUCOSE 89 01/19/2020   NA 140 01/19/2020   K 4.2 01/19/2020   CL 104 01/19/2020   CO2 21 01/19/2020   BUN 14 01/19/2020   CREATININE 0.85 01/19/2020   GFRNONAA 96 01/19/2020   GFRAA 111 01/19/2020   CALCIUM 10.0 01/19/2020   PROT 6.6 01/19/2020   ALBUMIN 4.5 01/19/2020   LABGLOB 2.1 01/19/2020   AGRATIO 2.1 01/19/2020   BILITOT 0.9 01/19/2020   ALKPHOS 70 01/19/2020   AST 55 (H) 01/19/2020   ALT 83 (H) 01/19/2020   ANIONGAP 6 09/25/2017   Last lipids Lab Results  Component Value Date   CHOL 186 01/19/2020   HDL 60 01/19/2020   LDLCALC 113 (H) 01/19/2020   TRIG 70 01/19/2020   Last hemoglobin A1c No results found for: HGBA1C Last thyroid functions Lab Results  Component Value Date   TSH 1.140 01/19/2020   Last vitamin D Lab Results  Component Value Date   VD25OH 12.5 (L) 01/19/2020   Last vitamin B12 and Folate No results found for: VITAMINB12, FOLATE    Objective    BP 118/78 (BP Location: Left Arm, Patient Position: Sitting, Cuff Size: Normal)   Pulse 81   Temp (!) 97.3 F (36.3 C) (Temporal)   Wt 166 lb 12.8 oz (75.7 kg)    SpO2 96%   BMI 29.55 kg/m    Media Information   Document Information  Photos  Left upper back   04/11/2020 10:39  Attached To:  Office Visit on 04/11/20 with Suzann Lazaro, Eula Fried, FNP  Source Information  Ricca Melgarejo, Eula Fried, FNP  [facility]  Media Information   Document Information  Photos  Left upper back lesion skin  04/11/2020 10:38  Attached To:  Office Visit on 04/11/20 with Kaylise Blakeley, Eula Fried, FNP  Source Information  Graden Hoshino, Eula Fried, FNP  [facility]     Physical Exam Constitutional:      General: She is not in acute distress.    Appearance: Normal appearance. She is not ill-appearing, toxic-appearing or diaphoretic.     Comments: Patient appers well, not sickly. Speaking in complete sentences. Patient moves on and off of exam table and in room without difficulty. Gait is normal in hall and in room. Patient is oriented to person place time and situation. Patient answers questions appropriately and engages eye contact and verbal dialect with provider.   HENT:     Head: Normocephalic and atraumatic.     Jaw: There is normal jaw occlusion.     Salivary Glands: Right salivary gland is not diffusely enlarged or tender. Left salivary gland is not diffusely enlarged or tender.     Right Ear: There is no impacted cerumen.     Left Ear: There is no impacted cerumen.     Ears:     Comments:  Cobblestoning posterior pharynx; bilateral allergic shiners; bilateral TMs air fluid level clear; bilateral nasal turbinates mild edema erythema clear discharge;       Nose: Congestion present.     Mouth/Throat:     Lips: Pink.     Mouth: Mucous membranes are moist.     Pharynx: Oropharynx is clear. Uvula midline. No oropharyngeal exudate or uvula swelling.  Eyes:     General: No scleral icterus.       Right eye: No discharge.     Conjunctiva/sclera: Conjunctivae normal.     Pupils: Pupils are equal, round, and reactive to light.  Neck:      Vascular: No carotid  bruit.  Cardiovascular:     Rate and Rhythm: Normal rate and regular rhythm.     Pulses: Normal pulses.     Heart sounds: Normal heart sounds. No murmur. No friction rub. No gallop.   Pulmonary:     Effort: Pulmonary effort is normal. No respiratory distress.     Breath sounds: Normal breath sounds. No stridor. No wheezing, rhonchi or rales.  Chest:     Chest wall: No tenderness.  Abdominal:     Palpations: Abdomen is soft.  Genitourinary:    Comments: Declined  Musculoskeletal:        General: Normal range of motion.     Cervical back: Normal range of motion.  Lymphadenopathy:     Cervical: No cervical adenopathy.  Skin:    General: Skin is warm and dry.     Capillary Refill: Capillary refill takes less than 2 seconds.     Findings: Lesion (left upper back " present for years changing lesion " ) present. No rash.     Comments: See media for photo for > 58mm lesion, dark, raised, and dry.   She did not report at last visit due to her " fear of doctors " but reports her mom and sister saw it at the beach and advised her to show provider.   Neurological:     General: No focal deficit present.     Mental Status: She is alert and oriented to person, place, and time. Mental status is at baseline.  Psychiatric:        Mood and Affect: Mood normal.        Behavior: Behavior normal.        Thought Content: Thought content normal.        Judgment: Judgment normal.       No results found for any visits on 04/11/20.  Assessment & Plan     Seasonal allergic rhinitis due to pollen - Plan: fluticasone (FLONASE) 50 MCG/ACT nasal spray  Acne vulgaris  Atypical pigmented skin lesion - Plan: Ambulatory referral to Dermatology  Skin lesion of back - Plan: Ambulatory referral to Dermatology  Thrombocytopenia [facility])  Smoker  Asperger's syndrome- reported history   Anxiety and depression  Orders Placed This Encounter  Procedures  . Ambulatory referral to  Dermatology    Referral Priority:   Urgent    Referral Type:   Consultation    Referral Reason:   Specialty Services Required    Referred to Provider:   Wanita Chamberlain, MD    Requested Specialty:   Dermatology    Number of Visits Requested:   1   Acne : skin mild infection with give Doxycycline as below. She also will start Flonase nasal spray as below.  Meds ordered this encounter  Medications  . fluticasone (FLONASE) 50 MCG/ACT nasal spray    Sig: Place 2 sprays into both nostrils daily.    Dispense:  16 g    Refill:  6  . doxycycline (VIBRA-TABS) 100 MG tablet    Sig: Take 1 tablet (100 mg total) by mouth 2 (two) times daily.    Dispense:  20 tablet    Refill:  0   Advised patient call the office or your primary care doctor for an appointment if no improvement within 72 hours or if any symptoms change or worsen at any time  Advised ER or urgent Care if after hours or on weekend. Call 911 for emergency symptoms at any time.Patinet verbalized understanding of  all instructions given/reviewed and treatment plan and has no further questions or concerns at this time.    Follow up labs as advised last visit.   Keep follow up June 7 with primary care and she wants to see Delight HohBenitiz Graham for dermatology placed referral for biopsy evaluation very irregular dark color.   IBeverely Pace, Imaan Padgett Smith Rayyan Burley, FNP, have reviewed all documentation for this visit. The documentation on 04/11/20 for the exam, diagnosis, procedures, and orders are all accurate and complete.   Jairo BenMichelle Smith Deeric Cruise, FNP  [facility] [phone] (phone) [phone] (fax)  [facility]

## 2020-04-12 NOTE — Addendum Note (Signed)
Addended by: Berniece Pap on: 04/12/2020 09:16 AM   Modules accepted: Level of Service

## 2020-05-02 ENCOUNTER — Other Ambulatory Visit: Payer: Self-pay

## 2020-05-02 ENCOUNTER — Encounter: Payer: Self-pay | Admitting: Adult Health

## 2020-05-02 ENCOUNTER — Telehealth (INDEPENDENT_AMBULATORY_CARE_PROVIDER_SITE_OTHER): Payer: BC Managed Care – PPO | Admitting: Adult Health

## 2020-05-02 DIAGNOSIS — D696 Thrombocytopenia, unspecified: Secondary | ICD-10-CM | POA: Diagnosis not present

## 2020-05-02 DIAGNOSIS — E559 Vitamin D deficiency, unspecified: Secondary | ICD-10-CM

## 2020-05-02 DIAGNOSIS — F329 Major depressive disorder, single episode, unspecified: Secondary | ICD-10-CM

## 2020-05-02 DIAGNOSIS — L989 Disorder of the skin and subcutaneous tissue, unspecified: Secondary | ICD-10-CM

## 2020-05-02 DIAGNOSIS — F32A Depression, unspecified: Secondary | ICD-10-CM

## 2020-05-02 DIAGNOSIS — F419 Anxiety disorder, unspecified: Secondary | ICD-10-CM

## 2020-05-02 NOTE — Patient Instructions (Addendum)
Come to lab for your due labs - we will  Quality Sleep Information, Adult Quality sleep is important for your mental and physical health. It also improves your quality of life. Quality sleep means you:  Are asleep for most of the time you are in bed.  Fall asleep within 30 minutes.  Wake up no more than once a night.  Are awake for no longer than 20 minutes if you do wake up during the night. Most adults need 7-8 hours of quality sleep each night. How can poor sleep affect me? If you do not get enough quality sleep, you may have:  Mood swings.  Daytime sleepiness.  Confusion.  Decreased reaction time.  Sleep disorders, such as insomnia and sleep apnea.  Difficulty with: ? Solving problems. ? Coping with stress. ? Paying attention. These issues may affect your performance and productivity at work, school, and at home. Lack of sleep may also put you at higher risk for accidents, suicide, and risky behaviors. If you do not get quality sleep you may also be at higher risk for several health problems, including:  Infections.  Type 2 diabetes.  Heart disease.  High blood pressure.  Obesity.  Worsening of long-term conditions, like arthritis, kidney disease, depression, Parkinson's disease, and epilepsy. What actions can I take to get more quality sleep?      Stick to a sleep schedule. Go to sleep and wake up at about the same time each day. Do not try to sleep less on weekdays and make up for lost sleep on weekends. This does not work.  Try to get about 30 minutes of exercise on most days. Do not exercise 2-3 hours before going to bed.  Limit naps during the day to 30 minutes or less.  Do not use any products that contain nicotine or tobacco, such as cigarettes or e-cigarettes. If you need help quitting, ask your health care provider.  Do not drink caffeinated beverages for at least 8 hours before going to bed. Coffee, tea, and some sodas contain caffeine.  Do  not drink alcohol close to bedtime.  Do not eat large meals close to bedtime.  Do not take naps in the late afternoon.  Try to get at least 30 minutes of sunlight every day. Morning sunlight is best.  Make time to relax before bed. Reading, listening to music, or taking a hot bath promotes quality sleep.  Make your bedroom a place that promotes quality sleep. Keep your bedroom dark, quiet, and at a comfortable room temperature. Make sure your bed is comfortable. Take out sleep distractions like TV, a computer, smartphone, and bright lights.  If you are lying awake in bed for longer than 20 minutes, get up and do a relaxing activity until you feel sleepy.  Work with your health care provider to treat medical conditions that may affect sleeping, such as: ? Nasal obstruction. ? Snoring. ? Sleep apnea and other sleep disorders.  Talk to your health care provider if you think any of your prescription medicines may cause you to have difficulty falling or staying asleep.  If you have sleep problems, talk with a sleep consultant. If you think you have a sleep disorder, talk with your health care provider about getting evaluated by a specialist. Where to find more information  National Sleep Foundation website: https://sleepfoundation.org  National Heart, Lung, and Blood Institute (NHLBI): https://hall.info/.pdf  Centers for Disease Control and Prevention (CDC): [employer]ail[employer].is Contact a health care provider if you:  Have  trouble getting to sleep or staying asleep.  Often wake up very early in the morning and cannot get back to sleep.  Have daytime sleepiness.  Have daytime sleep attacks of suddenly falling asleep and sudden muscle weakness (narcolepsy).  Have a tingling sensation in your legs with a strong urge to move your legs (restless legs syndrome).  Stop breathing briefly during sleep (sleep apnea).  Think you have a sleep  disorder or are taking a medicine that is affecting your quality of sleep. Summary  Most adults need 7-8 hours of quality sleep each night.  Getting enough quality sleep is an important part of health and well-being.  Make your bedroom a place that promotes quality sleep and avoid things that may cause you to have poor sleep, such as alcohol, caffeine, smoking, and large meals.  Talk to your health care provider if you have trouble falling asleep or staying asleep. This information is not intended to replace advice given to you by your health care provider. Make sure you discuss any questions you have with your health care provider. Document Revised: 02/19/2018 Document Reviewed: 02/19/2018 Elsevier Patient Education  [place]. Generalized Anxiety Disorder, Adult Generalized anxiety disorder (GAD) is a mental health disorder. People with this condition constantly worry about everyday events. Unlike normal anxiety, worry related to GAD is not triggered by a specific event. These worries also do not fade or get better with time. GAD interferes with life functions, including relationships, work, and school. GAD can vary from mild to severe. People with severe GAD can have intense waves of anxiety with physical symptoms (panic attacks). What are the causes? The exact cause of GAD is not known. What increases the risk? This condition is more likely to develop in:  Women.  People who have a family history of anxiety disorders.  People who are very shy.  People who experience very stressful life events, such as the death of a loved one.  People who have a very stressful family environment. What are the signs or symptoms? People with GAD often worry excessively about many things in their lives, such as their health and family. They may also be overly concerned about:  Doing well at work.  Being on time.  Natural disasters.  Friendships. Physical symptoms of GAD  include:  Fatigue.  Muscle tension or having muscle twitches.  Trembling or feeling shaky.  Being easily startled.  Feeling like your heart is pounding or racing.  Feeling out of breath or like you cannot take a deep breath.  Having trouble falling asleep or staying asleep.  Sweating.  Nausea, diarrhea, or irritable bowel syndrome (IBS).  Headaches.  Trouble concentrating or remembering facts.  Restlessness.  Irritability. How is this diagnosed? Your health care provider can diagnose GAD based on your symptoms and medical history. You will also have a physical exam. The health care provider will ask specific questions about your symptoms, including how severe they are, when they started, and if they come and go. Your health care provider may ask you about your use of alcohol or drugs, including prescription medicines. Your health care provider may refer you to a mental health specialist for further evaluation. Your health care provider will do a thorough examination and may perform additional tests to rule out other possible causes of your symptoms. To be diagnosed with GAD, a person must have anxiety that:  Is out of his or her control.  Affects several different aspects of his or her life,  such as work and relationships.  Causes distress that makes him or her unable to take part in normal activities.  Includes at least three physical symptoms of GAD, such as restlessness, fatigue, trouble concentrating, irritability, muscle tension, or sleep problems. Before your health care provider can confirm a diagnosis of GAD, these symptoms must be present more days than they are not, and they must last for six months or longer. How is this treated? The following therapies are usually used to treat GAD:  Medicine. Antidepressant medicine is usually prescribed for long-term daily control. Antianxiety medicines may be added in severe cases, especially when panic attacks occur.  Talk  therapy (psychotherapy). Certain types of talk therapy can be helpful in treating GAD by providing support, education, and guidance. Options include: ? Cognitive behavioral therapy (CBT). People learn coping skills and techniques to ease their anxiety. They learn to identify unrealistic or negative thoughts and behaviors and to replace them with positive ones. ? Acceptance and commitment therapy (ACT). This treatment teaches people how to be mindful as a way to cope with unwanted thoughts and feelings. ? Biofeedback. This process trains you to manage your body's response (physiological response) through breathing techniques and relaxation methods. You will work with a therapist while machines are used to monitor your physical symptoms.  Stress management techniques. These include yoga, meditation, and exercise. A mental health specialist can help determine which treatment is best for you. Some people see improvement with one type of therapy. However, other people require a combination of therapies. Follow these instructions at home:  Take over-the-counter and prescription medicines only as told by your health care provider.  Try to maintain a normal routine.  Try to anticipate stressful situations and allow extra time to manage them.  Practice any stress management or self-calming techniques as taught by your health care provider.  Do not punish yourself for setbacks or for not making progress.  Try to recognize your accomplishments, even if they are small.  Keep all follow-up visits as told by your health care provider. This is important. Contact a health care provider if:  Your symptoms do not get better.  Your symptoms get worse.  You have signs of depression, such as: ? A persistently sad, cranky, or irritable mood. ? Loss of enjoyment in activities that used to bring you joy. ? Change in weight or eating. ? Changes in sleeping habits. ? Avoiding friends or family  members. ? Loss of energy for normal tasks. ? Feelings of guilt or worthlessness. Get help right away if:  You have serious thoughts about hurting yourself or others. If you ever feel like you may hurt yourself or others, or have thoughts about taking your own life, get help right away. You can go to your nearest emergency department or call:  Your local emergency services (911 in the [place]  A suicide crisis helpline, such as the National Suicide Prevention Lifeline at 605-281-7851. This is open 24 hours a day. Summary  Generalized anxiety disorder (GAD) is a mental health disorder that involves worry that is not triggered by a specific event.  People with GAD often worry excessively about many things in their lives, such as their health and family.  GAD may cause physical symptoms such as restlessness, trouble concentrating, sleep problems, frequent sweating, nausea, diarrhea, headaches, and trembling or muscle twitching.  A mental health specialist can help determine which treatment is best for you. Some people see improvement with one type of therapy. However, other people  require a combination of therapies. This information is not intended to replace advice given to you by your health care provider. Make sure you discuss any questions you have with your health care provider. Document Revised: 10/25/2017 Document Reviewed: 10/02/2016 Elsevier Patient Education  2020 [employer].

## 2020-05-02 NOTE — Progress Notes (Signed)
**Note Kristy-Identified via Obfuscation** Established patient visit   Patient: Kristy Knight   DOB: 27-Mar-1996   23 y.o. Female  MRN: [id number] Visit Date: 05/02/2020  Today's healthcare provider: Marcille Buffy, FNP   Chief Complaint  Patient presents with  . Depression  . Anxiety  I,Louetta Hollingshead Smith Gabbi Whetstone,acting as a Education administrator for [employer], FNP.,have documented all relevant documentation on the behalf of Marcille Buffy, FNP,as directed by  Marcille Buffy, FNP while in the presence of Marcille Buffy, [place].  Subjective    HPI Depression, Follow-up  She  was last seen for this 3 months ago. Changes made at last visit include patient was advised to keep her psychiatry appointment 02/04/20 and she has resources for walk in clinics.   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 She reports good compliance with treatment. She is not having side effects.   She reports good tolerance of treatment. Current symptoms include: none She feels she is Improved since last visit.  Depression screen [facility] 2/9 05/02/2020 01/18/2020  Decreased Interest 0 -  Down, Depressed, Hopeless 1 2  PHQ - 2 Score 1 2  Altered sleeping 2 -  Tired, decreased energy 2 -  Change in appetite 0 3  Feeling bad or failure about yourself  1 -  Trouble concentrating 0 2  Moving slowly or fidgety/restless 0 -  Suicidal thoughts 0 -  PHQ-9 Score 6 -  Difficult doing work/chores Somewhat difficult -    ----------------------------------------------------------------------------------------- Anxiety, Follow-up  She was last seen for anxiety 3 months ago. Changes made at last visit include patient was advised to keep her psychiatry appointment 02/04/20 and she has resources for walk in clinics.   She reports good compliance with treatment. She reports good tolerance of treatment. She is not having side effects.   She feels her anxiety is mild and Improved since last  visit.  Symptoms: No chest pain No difficulty concentrating  No dizziness No fatigue  No feelings of losing control No insomnia  No irritable No palpitations  No panic attacks No racing thoughts  No shortness of breath No sweating  No tremors/shakes    GAD-7 Results GAD-7 Generalized Anxiety Disorder Screening Tool 05/02/2020 01/18/2020  1. Feeling Nervous, Anxious, or on Edge 1 3  2. Not Being Able to Stop or Control Worrying 1 3  3. Worrying Too Much About Different Things 1 3  4. Trouble Relaxing 1 3  5. Being So Restless it's Hard To Sit Still 1 3  6. Becoming Easily Annoyed or Irritable 0 3  7. Feeling Afraid As If Something Awful Might Happen 0 3  Total GAD-7 Score 5 21  Difficulty At Work, Home, or Getting  Along With Others? Somewhat difficult Extremely difficult    PHQ-9 Scores PHQ9 SCORE ONLY 05/02/2020 01/18/2020  PHQ-9 Total Score 6 7     Patient  denies any fever, body aches,chills, rash, chest pain, shortness of breath, nausea, vomiting, or diarrhea.   Denies dizziness, lightheadedness, pre syncopal or syncopal episodes.  ---------------------------------------------------------------------------------------------------   Patient Active Problem List   Diagnosis Date Noted  . Skin lesion of back 04/11/2020  . Atypical pigmented skin lesion 04/11/2020  . Acne vulgaris 04/11/2020  . Seasonal allergic rhinitis due to pollen 04/11/2020  . Thrombocytopenia ([place]) 04/11/2020  . MDD (major depressive disorder), recurrent episode, moderate ([place]) 02/04/2020  . Alcohol use disorder, mild, in early remission 02/04/2020  . Vitamin D deficiency 01/18/2020  .  History of risk factor for suicide 01/18/2020  . Asperger's syndrome 01/18/2020  . Anxiety and depression 01/18/2020  . Depression, major, single episode, severe (HCC) 01/18/2020  . Screening for thyroid disorder 01/18/2020  . Smoker 01/18/2020   Past Medical History:  Diagnosis Date  . Anxiety    No past  surgical history on file. No Known Allergies     Medications: Outpatient Medications Prior to Visit  Medication Sig  . fluticasone (FLONASE) 50 MCG/ACT nasal spray Place 2 sprays into both nostrils daily.  . hydrOXYzine (ATARAX/VISTARIL) 25 MG tablet Take 1 tablet (25 mg total) by mouth 2 (two) times daily as needed for anxiety.  . sertraline (ZOLOFT) 100 MG tablet Take 1 tablet (100 mg total) by mouth daily.  . Vitamin D, Ergocalciferol, (DRISDOL) 1.25 MG (50000 UNIT) CAPS capsule Take 1 capsule (50,000 Units total) by mouth every 7 (seven) days. (taking one tablet per week)  . doxycycline (VIBRA-TABS) 100 MG tablet Take 1 tablet (100 mg total) by mouth 2 (two) times daily. (Patient not taking: Reported on 05/02/2020)   No facility-administered medications prior to visit.    Review of Systems  Constitutional: Negative.   HENT: Negative.   Respiratory: Negative.   Cardiovascular: Negative.   Gastrointestinal: Negative.   Genitourinary: Negative.   Musculoskeletal: Negative.   Skin: Positive for wound.  Neurological: Negative.   Psychiatric/Behavioral: Negative.     Last CBC Lab Results  Component Value Date   WBC 5.5 01/19/2020   HGB 13.1 01/19/2020   HCT 39.3 01/19/2020   MCV 90 01/19/2020   MCH 29.8 01/19/2020   RDW 11.4 (L) 01/19/2020   PLT 135 (L) 01/19/2020   Last metabolic panel Lab Results  Component Value Date   GLUCOSE 89 01/19/2020   NA 140 01/19/2020   K 4.2 01/19/2020   CL 104 01/19/2020   CO2 21 01/19/2020   BUN 14 01/19/2020   CREATININE 0.85 01/19/2020   GFRNONAA 96 01/19/2020   GFRAA 111 01/19/2020   CALCIUM 10.0 01/19/2020   PROT 6.6 01/19/2020   ALBUMIN 4.5 01/19/2020   LABGLOB 2.1 01/19/2020   AGRATIO 2.1 01/19/2020   BILITOT 0.9 01/19/2020   ALKPHOS 70 01/19/2020   AST 55 (H) 01/19/2020   ALT 83 (H) 01/19/2020   ANIONGAP 6 09/25/2017   Last lipids Lab Results  Component Value Date   CHOL 186 01/19/2020   HDL 60 01/19/2020   LDLCALC  113 (H) 01/19/2020   TRIG 70 01/19/2020   Last hemoglobin A1c No results found for: HGBA1C Last thyroid functions Lab Results  Component Value Date   TSH 1.140 01/19/2020   Last vitamin D Lab Results  Component Value Date   VD25OH 12.5 (L) 01/19/2020   Last vitamin B12 and Folate No results found for: VITAMINB12, FOLATE    Objective    There were no vitals taken for this visit. BP Readings from Last 3 Encounters:  04/11/20 118/78  01/29/20 124/78  04/30/18 (!) 120/91      Physical Exam   Alert and engages on video .  Patient is alert and oriented and responsive to questions Engages in conversation with provider. Speaks in full sentences without any pauses without any shortness of breath or distress.    No results found for any visits on 05/02/20.  Assessment & Plan      Anxiety and depression  Skin lesion of back  Vitamin D deficiency  Thrombocytopenia (HCC)   Keep psychiatry visit follow ups.  She has  seen Dr. Norwood Levo [facility] dermatology for atypical appearing Nevi and actinic keratosis- she reports she got a call from dermatology office that all was ok with no further work up. This provider has not seen those results yet. Patient is also requesting information she has scheduled with [facility] in September she said she did not make this appointment. She is to call Dr. Charna Elizabeth office and follow up regarding this and her results.  Patient verbalized understanding of all instructions given and denies any further questions at this time. She reports area is still healing - from removal of skin lesion. Denies signs of infection advised to schedule recheck with dermatology or PCP if symptoms of infection or not healing.   She denies any need for refills at this time.   She can reach out with any concerns. Due for recheck CBC, CMP and Vitamin D level - lab orders are in and she is aware she can walk in to lab-hours given and days open.   Will result to  [facility].  Discussed work life balance and ways to interact with family/ friends given the difficulty of third shift. She does overall prefer to continue 3rd shift and has only been back working for the last couple of months so still adjusting. She reports she is sleeping well. Social anxiety is much improved with psychiatry care.    Discussed known black box warning for anti depression/ anxiety medication. Need to report any behavioral changes right, if any homicidal or suicidal thoughts or ideas seek medical attention right away. Call 911.    Return in about 3 months (around 08/02/2020), or if symptoms worsen or fail to improve, for at any time for any worsening symptoms, Go to Emergency room/ urgent care if worse.      IBeverely Pace Montie Swiderski, FNP, have reviewed all documentation for this visit. The documentation on 05/02/20 for the exam, diagnosis, procedures, and orders are all accurate and complete.    Jairo Ben, FNP  [facility] [phone] (phone) [phone] (fax)  [facility]

## 2020-05-17 ENCOUNTER — Telehealth (HOSPITAL_COMMUNITY): Payer: BC Managed Care – PPO | Admitting: Psychiatry

## 2020-05-17 ENCOUNTER — Other Ambulatory Visit: Payer: Self-pay

## 2020-05-17 ENCOUNTER — Telehealth: Payer: BC Managed Care – PPO | Admitting: Psychiatry

## 2020-06-01 ENCOUNTER — Ambulatory Visit: Payer: BLUE CROSS/BLUE SHIELD | Admitting: Dermatology

## 2020-06-14 ENCOUNTER — Other Ambulatory Visit: Payer: Self-pay | Admitting: Psychiatry

## 2020-06-14 DIAGNOSIS — F419 Anxiety disorder, unspecified: Secondary | ICD-10-CM

## 2020-06-14 DIAGNOSIS — F3341 Major depressive disorder, recurrent, in partial remission: Secondary | ICD-10-CM

## 2020-07-27 ENCOUNTER — Ambulatory Visit: Payer: BLUE CROSS/BLUE SHIELD | Admitting: Dermatology

## 2020-12-02 ENCOUNTER — Telehealth: Payer: BC Managed Care – PPO | Admitting: Psychiatry

## 2020-12-02 ENCOUNTER — Telehealth (HOSPITAL_COMMUNITY): Payer: Self-pay | Admitting: *Deleted

## 2020-12-02 ENCOUNTER — Other Ambulatory Visit: Payer: Self-pay

## 2020-12-02 DIAGNOSIS — F3341 Major depressive disorder, recurrent, in partial remission: Secondary | ICD-10-CM

## 2020-12-02 DIAGNOSIS — F419 Anxiety disorder, unspecified: Secondary | ICD-10-CM

## 2020-12-02 MED ORDER — SERTRALINE HCL 100 MG PO TABS: 100.0000 mg | ORAL_TABLET | Freq: Every day | ORAL | 1 refills | Status: DC

## 2020-12-02 MED ORDER — HYDROXYZINE HCL 25 MG PO TABS: 25.0000 mg | ORAL_TABLET | Freq: Two times a day (BID) | ORAL | 0 refills | Status: DC | PRN

## 2020-12-02 NOTE — Telephone Encounter (Signed)
PATIENT CALLED REQUESTING REFILLS UNTIL NEXT APPT 01/04/20

## 2020-12-02 NOTE — Addendum Note (Signed)
Addended by: Zena Amos on: 12/02/2020 10:57 AM   Modules accepted: Orders

## 2020-12-02 NOTE — Telephone Encounter (Signed)
Rx sent to [facility] pharmacy.  

## 2021-01-03 ENCOUNTER — Ambulatory Visit (INDEPENDENT_AMBULATORY_CARE_PROVIDER_SITE_OTHER): Payer: BC Managed Care – PPO | Admitting: Psychiatry

## 2021-01-03 ENCOUNTER — Other Ambulatory Visit: Payer: Self-pay

## 2021-01-03 ENCOUNTER — Encounter (HOSPITAL_COMMUNITY): Payer: Self-pay | Admitting: Psychiatry

## 2021-01-03 VITALS — BP 121/84 | HR 74 | Ht 63.0 in | Wt 149.0 lb

## 2021-01-03 DIAGNOSIS — F419 Anxiety disorder, unspecified: Secondary | ICD-10-CM

## 2021-01-03 DIAGNOSIS — F840 Autistic disorder: Secondary | ICD-10-CM

## 2021-01-03 DIAGNOSIS — F3341 Major depressive disorder, recurrent, in partial remission: Secondary | ICD-10-CM | POA: Diagnosis not present

## 2021-01-03 DIAGNOSIS — F1011 Alcohol abuse, in remission: Secondary | ICD-10-CM

## 2021-01-03 MED ORDER — SERTRALINE HCL 100 MG PO TABS: 100.0000 mg | ORAL_TABLET | Freq: Every day | ORAL | 2 refills | Status: DC

## 2021-01-03 MED ORDER — HYDROXYZINE HCL 25 MG PO TABS: 25.0000 mg | ORAL_TABLET | Freq: Two times a day (BID) | ORAL | 2 refills | Status: DC | PRN

## 2021-01-03 NOTE — Progress Notes (Signed)
BH MD/PA/NP OP Progress Note   01/03/2021 11:07 AM Kristy Knight  MRN:  [id number]  Chief Complaint:  Chief Complaint    Follow-up     " I have been okay."  HPI: Patient was last seen by the writer in April 2021.  Patient informed that she took her medications for a few months after the last visit but then after running out she stopped the medications.  She had contacted the clinic in January and restarted her medications after the writer sent the prescriptions.  Today, patient reported that she is doing well.  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  She stated that she works night shifts and works 4 nights in a row.  She is off during the weekdays and works Friday night to Monday night. She stated that she has been sober and has not touched alcohol since last January.  She is proud of herself for that. She denied being in any new relationships since the writer last spoke to her.  She informed that she started the Zoloft as half tablet for a few days before increasing it back to whole tablet daily as the medicine makes her feel nauseous in the beginning.  She stated that she is a little worried about her mother because her grandfather is now on end-of-life hospice care.  There is a possibility that he may possibly any time now.  She stated that she had lost her grandmother few days before Thanksgiving and losing both her parents in such a short time span will be very difficult for her mother. She stated that she is able to take care of her bills by herself and is living on her own in an apartment. She had temporarily been living with a sister of hers but she is no longer in touch with her.  When writer asked her the reason she gave a vague response pointing to her brother-in-law as a reason for them not being in touch.  She mentioned that her mother has total of 8 children and her youngest sibling is 31  years old.  Visit Diagnosis:    ICD-10-CM   1. MDD (major depressive disorder), recurrent, in partial remission (HCC)  F33.41   2. Anxiety  F41.9   3. Autism spectrum disorder  F84.0   4. Alcohol use disorder, mild, in sustained remission  F10.11     Past Psychiatric History: MDD, anxiety, autism spectrum disorder, alcohol use disorder  Past Medical History:  Past Medical History:  Diagnosis Date  . Anxiety    No past surgical history on file.  Family Psychiatric History: Sister-history of depression, currently on Celexa, Lamictal, BuSpar  Family History: No family history on file.  Social History:  Social History   Socioeconomic History  . Marital status: Single    Spouse name: Not on file  . Number of children: Not on file  . Years of education: Not on file  . Highest education level: Not on file  Occupational History  . Not on file  Tobacco Use  . Smoking status: Current Every Day Smoker    Packs/day: 1.00    Types: Cigarettes  . Smokeless tobacco: Never Used  Vaping Use  . Vaping Use: Never used  Substance and Sexual Activity  . Alcohol use: No  . Drug use: No  . Sexual activity: Not on file  Other Topics Concern  . Not on file  Social History Narrative  . Not on file   Social Determinants of Health   Financial Resource Strain: Not on file  Food Insecurity: Not on file  Transportation Needs: Not on file  Physical Activity: Not on file  Stress: Not on file  Social Connections: Not on file    Allergies: No Known Allergies  Metabolic Disorder Labs: No results found for: HGBA1C, MPG No results found for: PROLACTIN Lab Results  Component Value Date   CHOL 186 01/19/2020   TRIG 70 01/19/2020   HDL 60 01/19/2020   LDLCALC 113 (H) 01/19/2020   Lab Results  Component Value Date   TSH 1.140 01/19/2020    Therapeutic Level Labs: No results found for: LITHIUM No results found for: VALPROATE No components found for:  CBMZ  Current  Medications: Current Outpatient Medications  Medication Sig Dispense Refill  . doxycycline (VIBRA-TABS) 100 MG tablet Take 1 tablet (100 mg total) by mouth 2 (two) times daily. 20 tablet 0  . fluticasone (FLONASE) 50 MCG/ACT nasal spray Place 2 sprays into both nostrils daily. 16 g 6  . hydrOXYzine (ATARAX/VISTARIL) 25 MG tablet Take 1 tablet (25 mg total) by mouth 2 (two) times daily as needed for anxiety. 30 tablet 0  . sertraline (ZOLOFT) 100 MG tablet Take 1 tablet (100 mg total) by mouth daily. 30 tablet 1  . Vitamin D, Ergocalciferol, (DRISDOL) 1.25 MG (50000 UNIT) CAPS capsule Take 1 capsule (50,000 Units total) by mouth every 7 (seven) days. (taking one tablet per week) 10 capsule 0   No current facility-administered medications for this visit.        Psychiatric Specialty Exam: Review of Systems  There were no vitals taken for this visit.There is no height or weight on file to calculate BMI.  General Appearance: Fairly groomed  Eye Contact:  Good  Speech:  Clear and Coherent and Normal Rate  Volume:  Normal  Mood:  Euthymic  Affect: Restricted  Thought Process:  Goal Directed, Linear and Descriptions of Associations: Intact  Orientation:  Full (Time, Place, and Person)  Thought Content: Logical   Suicidal Thoughts:  No  Homicidal Thoughts:  No  Memory:  Recent;   Good Remote;   Good  Judgement: Fair  Insight: Fair  Psychomotor Activity:  Normal  Concentration:  Concentration: Good and Attention Span: Good  Recall:  Good  Fund of Knowledge: Good  Language: Good  Akathisia:  Negative  Handed:  Right  AIMS (if indicated): not done  Assets:  Communication Skills Desire for Improvement Financial Resources/Insurance Housing  ADL's:  Intact  Cognition: WNL  Sleep: Fair, is used to sleeping during the day and staying up at night due to working night shifts.     Screenings: GAD-7   Flowsheet Row Video Visit from 05/02/2020 in [facility] Patient  Message from 01/18/2020 in [facility]  Total GAD-7 Score 5 21    PHQ2-9   Flowsheet Row Office Visit from 01/03/2021 in [facility] Video Visit from 05/02/2020 in [facility] Patient Message from 01/18/2020 in [facility]  PHQ-2 Total Score 0 1 2 (P)   PHQ-9 Total Score - 6 -    Flowsheet Row Office Visit from 01/03/2021 in [facility]  C-SSRS RISK CATEGORY No Risk       Assessment and Plan: Patient seen almost 10 months after her last visit.  Patient seems to be doing fairly well at present.  She has  restarted her medication sertraline and hydroxyzine and is tolerating them well.  She has maintained a stable job.  She has not relapsed on alcohol.  VS: Blood pressure 121/84, pulse 74, height 5\' 3"  (1.6 m), weight 149 lb (67.6 kg), SpO2 93 %.  1. MDD (major depressive disorder), recurrent, in partial remission (HCC)  - sertraline (ZOLOFT) 100 MG tablet; Take 1 tablet (100 mg total) by mouth daily.  Dispense: 30 tablet; Refill: 1  2. Anxiety  -Continue hydrOXYzine (ATARAX/VISTARIL) 25 MG tablet; Take 1 tablet (25 mg total) by mouth 2 (two) times daily as needed for anxiety.  Dispense: 30 tablet; Refill: 0 -sertraline (ZOLOFT) 100 MG tablet; Take 1 tablet (100 mg total) by mouth daily.  Dispense: 30 tablet; Refill: 1  3. Autism spectrum disorder   4. Alcohol use disorder, mild, in sustained remission  Continue same medication regimen. Follow up in 3 months.    , MD 01/03/2021, 11:07 AM

## 2021-02-19 DIAGNOSIS — R42 Dizziness and giddiness: Secondary | ICD-10-CM | POA: Diagnosis not present

## 2021-02-19 DIAGNOSIS — K625 Hemorrhage of anus and rectum: Secondary | ICD-10-CM | POA: Diagnosis not present

## 2021-02-19 DIAGNOSIS — R109 Unspecified abdominal pain: Secondary | ICD-10-CM | POA: Diagnosis not present

## 2021-02-19 DIAGNOSIS — R197 Diarrhea, unspecified: Secondary | ICD-10-CM | POA: Diagnosis not present

## 2021-02-19 DIAGNOSIS — K921 Melena: Secondary | ICD-10-CM | POA: Diagnosis not present

## 2021-03-28 ENCOUNTER — Encounter (HOSPITAL_COMMUNITY): Payer: Self-pay | Admitting: Psychiatry

## 2021-03-28 ENCOUNTER — Telehealth (INDEPENDENT_AMBULATORY_CARE_PROVIDER_SITE_OTHER): Payer: BC Managed Care – PPO | Admitting: Psychiatry

## 2021-03-28 DIAGNOSIS — F419 Anxiety disorder, unspecified: Secondary | ICD-10-CM | POA: Diagnosis not present

## 2021-03-28 DIAGNOSIS — F3341 Major depressive disorder, recurrent, in partial remission: Secondary | ICD-10-CM | POA: Diagnosis not present

## 2021-03-28 DIAGNOSIS — F840 Autistic disorder: Secondary | ICD-10-CM

## 2021-03-28 DIAGNOSIS — F1011 Alcohol abuse, in remission: Secondary | ICD-10-CM

## 2021-03-28 MED ORDER — HYDROXYZINE HCL 25 MG PO TABS: 25.0000 mg | ORAL_TABLET | Freq: Two times a day (BID) | ORAL | 2 refills | Status: DC | PRN

## 2021-03-28 MED ORDER — SERTRALINE HCL 100 MG PO TABS: 100.0000 mg | ORAL_TABLET | Freq: Every day | ORAL | 2 refills | Status: DC

## 2021-03-28 NOTE — Progress Notes (Signed)
BH MD/PA/NP OP Progress Note  Virtual Visit via Video Note  I connected with Kristy Knight on 03/28/21 at 10:00 AM EDT by a video enabled telemedicine application and verified that I am speaking with the correct person using two identifiers.  Location: Patient: Car Provider: Clinic   I discussed the limitations of evaluation and management by telemedicine and the availability of in person appointments. The patient expressed understanding and agreed to proceed.  I provided 16 minutes of non-face-to-face time during this encounter.    03/28/2021 10:09 AM Kristy Knight  MRN:  [id number]  Chief Complaint: " I am okay I guess."   HPI: Patient reported she is okay.  She informed that she just got off her night shift.  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  She informed that her mother is undergoing a tough time after she lost her father a couple of months ago.  Patient is kind of worried about her mother.  Her mother is now worried about her own mother who is elderly and by herself. She stated that her mother is total of 8 kids and although she is in the middle she is the oldest one who lives locally.  And that makes her feel more responsible towards her mother. She stated that she had to take some time off of work after her grandfather passed away as she wanted to be there to be able to support her mother. She sometimes feels sad and anxious because of her mother but otherwise is able to manage. She stated that she is agreeable to continue same regimen for now and see how things go. She has not relapsed on alcohol and has been in remission for over a year now   Visit Diagnosis:    ICD-10-CM   1. MDD (major depressive disorder), recurrent, in partial remission (HCC)  F33.41   2. Anxiety  F41.9     Past Psychiatric History: MDD, anxiety, autism spectrum disorder, alcohol use disorder  Past Medical History:  Past Medical History:  Diagnosis Date  .  Anxiety    No past surgical history on file.  Family Psychiatric History: Sister-history of depression, currently on Celexa, Lamictal, BuSpar  Family History: No family history on file.  Social History:  Social History   Socioeconomic History  . Marital status: Single    Spouse name: Not on file  . Number of children: Not on file  . Years of education: Not on file  . Highest education level: Not on file  Occupational History  . Not on file  Tobacco Use  . Smoking status: Current Every Day Smoker    Packs/day: 1.00    Types: Cigarettes  . Smokeless tobacco: Never Used  Vaping Use  . Vaping Use: Never used  Substance and Sexual Activity  . Alcohol use: No  . Drug use: No  . Sexual activity: Not on file  Other Topics Concern  . Not on file  Social History Narrative  . Not on file   Social Determinants of Health   Financial Resource Strain: Not on file  Food Insecurity: Not on file  Transportation Needs: Not on file  Physical Activity: Not on file  Stress: Not on file  Social Connections: Not on file    Allergies: No Known Allergies  Metabolic Disorder Labs: No results found for: HGBA1C, MPG No results found for: PROLACTIN Lab Results  Component Value Date   CHOL 186 01/19/2020   TRIG 70 01/19/2020  HDL 60 01/19/2020   LDLCALC 113 (H) 01/19/2020   Lab Results  Component Value Date   TSH 1.140 01/19/2020    Therapeutic Level Labs: No results found for: LITHIUM No results found for: VALPROATE No components found for:  CBMZ  Current Medications: Current Outpatient Medications  Medication Sig Dispense Refill  . hydrOXYzine (ATARAX/VISTARIL) 25 MG tablet Take 1 tablet (25 mg total) by mouth 2 (two) times daily as needed for anxiety. 30 tablet 2  . sertraline (ZOLOFT) 100 MG tablet Take 1 tablet (100 mg total) by mouth daily. 30 tablet 2  . Vitamin D, Ergocalciferol, (DRISDOL) 1.25 MG (50000 UNIT) CAPS capsule Take 1 capsule (50,000 Units total) by mouth  every 7 (seven) days. (taking one tablet per week) 10 capsule 0   No current facility-administered medications for this visit.        Psychiatric Specialty Exam: Review of Systems  There were no vitals taken for this visit.There is no height or weight on file to calculate BMI.  General Appearance: Fairly groomed  Eye Contact:  Good  Speech:  Clear and Coherent and Normal Rate  Volume:  Normal  Mood:  Euthymic  Affect: Restricted  Thought Process:  Goal Directed, Linear and Descriptions of Associations: Intact  Orientation:  Full (Time, Place, and Person)  Thought Content: Logical   Suicidal Thoughts:  No  Homicidal Thoughts:  No  Memory:  Recent;   Good Remote;   Good  Judgement: Fair  Insight: Fair  Psychomotor Activity:  Normal  Concentration:  Concentration: Good and Attention Span: Good  Recall:  Good  Fund of Knowledge: Good  Language: Good  Akathisia:  Negative  Handed:  Right  AIMS (if indicated): not done  Assets:  Communication Skills Desire for Improvement Financial Resources/Insurance Housing  ADL's:  Intact  Cognition: WNL  Sleep: Fair, is used to sleeping during the day and staying up at night due to working night shifts.     Screenings: GAD-7   Flowsheet Row Office Visit from 01/03/2021 in [facility] Video Visit from 05/02/2020 in [facility] Patient Message from 01/18/2020 in [facility]  Total GAD-7 Score 2 5 21     PHQ2-9   Flowsheet Row Office Visit from 01/03/2021 in [facility] Video Visit from 05/02/2020 in [facility] Patient Message from 01/18/2020 in [facility]  PHQ-2 Total Score 0 1 2 (P)   PHQ-9 Total Score -- 6 --    Flowsheet Row Office Visit from 01/03/2021 in [facility]  C-SSRS RISK CATEGORY No Risk       Assessment and Plan: Patient is currently concerned about her mom as her mother recently  lost her father.  She stated that she is trying to be supportive towards her mother.  She is agreeable to continue the same regimen for now.  1. MDD (major depressive disorder), recurrent, in partial remission (HCC)  - sertraline (ZOLOFT) 100 MG tablet; Take 1 tablet (100 mg total) by mouth daily.  Dispense: 30 tablet; Refill: 1  2. Anxiety  -Continue hydrOXYzine (ATARAX/VISTARIL) 25 MG tablet; Take 1 tablet (25 mg total) by mouth 2 (two) times daily as needed for anxiety.  Dispense: 30 tablet; Refill: 0 -sertraline (ZOLOFT) 100 MG tablet; Take 1 tablet (100 mg total) by mouth daily.  Dispense: 30 tablet; Refill: 1  3. Autism spectrum disorder   4. Alcohol use disorder, mild, in sustained remission  Continue same medication  regimen. Follow up in 10 weeks. Patient was informed that her care is being transferred to a different psychiatrist at [facility] psychiatry clinic.  Patient verbalized her understanding.    Zena Amos, MD 03/28/2021, 10:09 AM

## 2021-05-30 ENCOUNTER — Other Ambulatory Visit: Payer: Self-pay

## 2021-05-30 ENCOUNTER — Telehealth: Payer: Self-pay | Admitting: Child and Adolescent Psychiatry

## 2021-05-30 ENCOUNTER — Telehealth: Payer: BC Managed Care – PPO | Admitting: Child and Adolescent Psychiatry

## 2021-05-30 NOTE — Telephone Encounter (Signed)
Pt was sent link via text and email to connect on video for telemedicine encounter for scheduled appointment, and was also followed up with phone call. Pt did not connect on the video, and writer left the VM requesting to connect on the video or call back to reschedule appointment if they are not able to connect today for appointment.   

## 2022-07-23 ENCOUNTER — Encounter: Payer: Self-pay | Admitting: Emergency Medicine

## 2022-07-23 ENCOUNTER — Other Ambulatory Visit: Payer: Self-pay

## 2022-07-23 ENCOUNTER — Emergency Department
Admission: EM | Admit: 2022-07-23 | Discharge: 2022-07-23 | Disposition: A | Discharge status: Home / Self Care | Payer: BC Managed Care – PPO | Attending: Emergency Medicine | Admitting: Emergency Medicine

## 2022-07-23 DIAGNOSIS — R197 Diarrhea, unspecified: Secondary | ICD-10-CM | POA: Diagnosis not present

## 2022-07-23 DIAGNOSIS — R55 Syncope and collapse: Secondary | ICD-10-CM | POA: Diagnosis not present

## 2022-07-23 DIAGNOSIS — E876 Hypokalemia: Secondary | ICD-10-CM | POA: Insufficient documentation

## 2022-07-23 LAB — URINALYSIS, ROUTINE W REFLEX MICROSCOPIC
Bilirubin Urine: NEGATIVE
Glucose, UA: NEGATIVE mg/dL
Ketones, ur: NEGATIVE mg/dL
Leukocytes,Ua: NEGATIVE
Nitrite: NEGATIVE
Protein, ur: NEGATIVE mg/dL
Specific Gravity, Urine: 1.001 — ABNORMAL LOW (ref 1.005–1.030)
pH: 7 (ref 5.0–8.0)

## 2022-07-23 LAB — BASIC METABOLIC PANEL
Anion gap: 7 (ref 5–15)
BUN: 16 mg/dL (ref 6–20)
CO2: 25 mmol/L (ref 22–32)
Calcium: 9.4 mg/dL (ref 8.9–10.3)
Chloride: 108 mmol/L (ref 98–111)
Creatinine, Ser: 0.7 mg/dL (ref 0.44–1.00)
GFR, Estimated: >60 mL/min (ref >60)
Glucose, Bld: 124 mg/dL — ABNORMAL HIGH (ref 70–99)
Potassium: 3.2 mmol/L — ABNORMAL LOW (ref 3.5–5.1)
Sodium: 140 mmol/L (ref 135–145)

## 2022-07-23 LAB — CBC
HCT: 35.4 % — ABNORMAL LOW (ref 36.0–46.0)
Hemoglobin: 11.8 g/dL — ABNORMAL LOW (ref 12.0–15.0)
MCH: 29.1 pg (ref 26.0–34.0)
MCHC: 33.3 g/dL (ref 30.0–36.0)
MCV: 87.4 fL (ref 80.0–100.0)
Platelets: 157 10*3/uL (ref 150–400)
RBC: 4.05 MIL/uL (ref 3.87–5.11)
RDW: 11.9 % (ref 11.5–15.5)
WBC: 6.2 10*3/uL (ref 4.0–10.5)
nRBC: 0 % (ref 0.0–0.2)

## 2022-07-23 LAB — TROPONIN I (HIGH SENSITIVITY): Troponin I (High Sensitivity): <2 ng/L (ref <18)

## 2022-07-23 LAB — POC URINE PREG, ED: Preg Test, Ur: NEGATIVE

## 2022-07-23 NOTE — ED Provider Notes (Signed)
[facility] Provider Note    Event Date/Time   First MD Initiated Contact with Patient 07/23/22 [phone]     (approximate)   History   Near Syncope   HPI  Kristy Knight is a 26 y.o. female with no active medical problems who presents with 2 episodes of near syncope over the last 3 days, both occurring while she was at work at [employer].  The patient states that during the episodes she became lightheaded, weak, pale, and felt her vision closing in and like she could not hear or speak.  Both episodes occurred as she had gotten up from sitting or crouching down.  They caused her to throw up.  She also has had some diarrhea.  The episodes resolved after a few minutes.  The patient did not have any palpitations, chest pain, or difficulty breathing.  She reports that her period started early and has had heavier bleeding and cramping than normal.  She denies any other abnormal bleeding.  She has no fever or chills.  The patient is on hydroxyzine and Zoloft but denies any other medications.  She denies any alcohol or drug use.     Physical Exam   Triage Vital Signs: ED Triage Vitals  Enc Vitals Group     BP 07/23/22 0525 139/78     Pulse Rate 07/23/22 0525 88     Resp 07/23/22 0525 18     Temp 07/23/22 0525 98.5 F (36.9 C)     Temp Source 07/23/22 0525 Oral     SpO2 07/23/22 0525 100 %     Weight 07/23/22 0530 130 lb (59 kg)     Height 07/23/22 0530 5\' 3"  (1.6 m)     Head Circumference --      Peak Flow --      Pain Score 07/23/22 0530 0     Pain Loc --      Pain Edu? --      Excl. in GC? --     Most recent vital signs: Vitals:   07/23/22 0525 07/23/22 0715  BP: 139/78 115/82  Pulse: 88 80  Resp: 18 18  Temp: 98.5 F (36.9 C)   SpO2: 100% 95%     General: Alert and oriented, well-appearing. CV:  Good peripheral perfusion.  Resp:  Normal effort.  Abd:  No distention.  Other:  EOMI.  PERRLA.  No facial droop.  5/5 motor strength and intact  sensation to all 4 extremities.  No pronator drift.  No ataxia on finger-to-nose.  Normal gait.   ED Results / Procedures / Treatments   Labs (all labs ordered are listed, but only abnormal results are displayed) Labs Reviewed  BASIC METABOLIC PANEL - Abnormal; Notable for the following components:      Result Value   Potassium 3.2 (*)    Glucose, Bld 124 (*)    All other components within normal limits  CBC - Abnormal; Notable for the following components:   Hemoglobin 11.8 (*)    HCT 35.4 (*)    All other components within normal limits  URINALYSIS, ROUTINE W REFLEX MICROSCOPIC - Abnormal; Notable for the following components:   Color, Urine COLORLESS (*)    APPearance CLEAR (*)    Specific Gravity, Urine 1.001 (*)    Hgb urine dipstick LARGE (*)    Bacteria, UA RARE (*)    All other components within normal limits  POC URINE PREG, ED  CBG MONITORING, ED  TROPONIN  I (HIGH SENSITIVITY)     EKG  ED ECG REPORT I, Dionne Bucy, the attending physician, personally viewed and interpreted this ECG.  Date: 07/23/2022 EKG Time: 0607 Rate: 80 Rhythm: normal sinus rhythm with PACs QRS Axis: normal Intervals: normal ST/T Wave abnormalities: Nonspecific T wave abnormalities Narrative Interpretation: Nonspecific abnormalities with no evidence of acute ischemia    RADIOLOGY   PROCEDURES:  Critical Care performed: No  Procedures   MEDICATIONS ORDERED IN ED: Medications - No data to display   IMPRESSION / MDM / ASSESSMENT AND PLAN / ED COURSE  I reviewed the triage vital signs and the nursing notes.  26 year old female with no active medical problems presents with 2 episodes of near syncope with prodrome of lightheadedness, both occurring while at work and in an orthostatic context.  I reviewed the past medical records.  The patient was mostly seen by her mental health provider on 5/3 and is being treated for major depressive disorder with Zoloft and anxiety  with hydroxyzine.  She is also diagnosed with autism spectrum disorder.  She has no other active medical problems.  On exam today the patient is well-appearing.  Her vital signs are normal.  The physical exam is unremarkable.  Neurologic exam is normal.  EKG shows nonspecific T wave abnormalities but no ischemic findings and also no evidence of Brugada syndrome, WPW, prolonged QT, or other abnormal intervals which would suggest a cardiogenic cause of syncope.  Differential diagnosis includes, but is not limited to, dehydration, hypoglycemia, cardiac arrhythmia, POTS.  Patient's presentation is most consistent with acute complicated illness / injury requiring diagnostic workup.  Basic labs are unremarkable except for borderline low potassium which is likely noncontributory and borderline anemia although not significant enough to be causing syncope.  The urinalysis shows no acute findings.  Pregnancy test is negative.  I have ordered a troponin given the slightly abnormal EKG although my suspicion for ischemia is very low.  ----------------------------------------- 8:35 AM on 07/23/2022 -----------------------------------------  Troponin is negative.  At this time, the patient remains asymptomatic and is stable for discharge home.  I did an extensive discussion with her about the results of the work-up, possible diagnoses, and the plan of care.  Overall the patient's presentation, given that these episodes both happened with postural changes, are consistent with POTS.  I recommend that the patient follow-up with cardiology as well as her primary care.  At this time the patient does not have any active abdominal pain and is not anemic due to her heavier than normal period.  She can follow-up with her OB/GYN.  There is no indication for further emergent diagnostics in the ED.  I ordered cardiology referral.  I gave the patient strict return precautions and she expressed understanding.  FINAL CLINICAL  IMPRESSION(S) / ED DIAGNOSES   Final diagnoses:  Near syncope     Rx / DC Orders   ED Discharge Orders          Ordered    Ambulatory referral to Cardiology       Comments: If you have not heard from the Cardiology office within the next 72 hours please call [phone].   07/23/22 1324             Note:  This document was prepared using Dragon voice recognition software and may include unintentional dictation errors.    Dionne Bucy, MD 07/23/22 [phone]

## 2022-07-23 NOTE — ED Triage Notes (Signed)
FIRST NURSE NOTE:  pt arrived via POV with reports of blood sugar of 81

## 2022-07-23 NOTE — Discharge Instructions (Addendum)
Based on your evaluation today there is no sign of a stroke, diabetes, heart arrhythmia, or other life-threatening condition.  We suspect that you may have POTS, however your symptoms can also be related to the heavy period or dehydration.  You should follow-up with your primary care doctor.  We have also provided a referral to cardiology and you will be contacted for follow-up appointment.  In the meantime, when at work, make sure you are drinking plenty of fluids throughout your shift, eat small amounts throughout the day, and be mindful of sudden changes in position especially from crouching or sitting to standing.  Return to the ER immediately for new, worsening, or persistent severe episodes of lightheadedness or passing out, palpitations, chest pain, difficulty breathing, severe headache, vomiting, worsening vaginal bleeding or abdominal pain, or any other new or worsening symptoms that concern you.

## 2022-07-23 NOTE — ED Notes (Signed)
See triage note  Presents s/p near syncopal episode at work  States she became real dizzy    States she has had about 3 episodes of same sx's for about 1 week

## 2022-07-23 NOTE — ED Triage Notes (Signed)
Pt arrived via POV with reports of near syncopal episode at work, pt reports her blood sugar was 81 then went up 190.  Pt reports similar incident occurred on Friday, pt states her period started a week early and has been heavy with clots.  Pt reports low abd pain, cramping feeling from period.

## 2022-07-24 ENCOUNTER — Ambulatory Visit: Payer: BC Managed Care – PPO | Admitting: Family Medicine

## 2022-07-24 ENCOUNTER — Encounter: Payer: Self-pay | Admitting: Family Medicine

## 2022-07-24 DIAGNOSIS — R55 Syncope and collapse: Secondary | ICD-10-CM | POA: Diagnosis not present

## 2022-07-24 NOTE — Progress Notes (Signed)
   SUBJECTIVE:   CHIEF COMPLAINT / HPI:   ER FOLLOW UP Hospital/facility: [facility] 8/28 Diagnosis: near syncope thought 2/2 orthostasis Procedures/tests:  - EKG NSR Consultants: none New medications: none Discharge instructions:   - outpt cardiology f/u, concern for POTS Status: better - occasional episodes of nausea, lightheadedness, dizziness, heart racing mainly with changing positions/standing. Most recent episode Friday at work leading to ED visit. Stood from sitting with onset of symptoms with subsequent vomiting and loss of consciousness. - episodes have occurred 3x this past week with episodes occurring as far back as middle school.  - currently menstruating. Unsure if episodes are associated with periods.    OBJECTIVE:   BP (!) 133/97 (BP Location: Left Arm, Patient Position: Sitting, Cuff Size: Normal)   Pulse 67   Temp 98.3 F (36.8 C) (Oral)   Resp 16   Ht 5\' 3"  (1.6 m)   Wt 133 lb 11.2 oz (60.6 kg)   LMP 07/19/2022 (Exact Date)   SpO2 99%   BMI 23.68 kg/m   Gen: well appearing, in NAD Card: RRR Lungs: CTAB Ext: WWP, no edema MSK: Full ROM, strength 5/5 to U/LE bilaterally, normal gait.  No edema.  Neuro: Alert and oriented, speech normal.  Optic field normal. PERRL, Extraocular movements intact.  Intact symmetric sensation to light touch of face and extremities bilaterally.  Hearing grossly intact bilaterally.  Tongue protrudes normally with no deviation.  Shoulder shrug, smile symmetric. Finger to nose normal.    ASSESSMENT/PLAN:   Near syncope Reviewed ED notes, labs, EKG. Likely postural/orthostatic trigger with concern for POTS. Normal cardiac/neuro exam today and currently asymptomatic. Agree with cardiology evaluation given multiple prior episodes and symptomatology. Recommend adequate oral hydration. Note provided for work accommodations.      07/21/2022, DO

## 2022-07-24 NOTE — Assessment & Plan Note (Signed)
Reviewed ED notes, labs, EKG. Likely postural/orthostatic trigger with concern for POTS. Normal cardiac/neuro exam today and currently asymptomatic. Agree with cardiology evaluation given multiple prior episodes and symptomatology. Recommend adequate oral hydration. Note provided for work accommodations.

## 2022-08-08 ENCOUNTER — Encounter: Payer: Self-pay | Admitting: Cardiovascular Disease

## 2022-08-08 ENCOUNTER — Ambulatory Visit (INDEPENDENT_AMBULATORY_CARE_PROVIDER_SITE_OTHER): Payer: BC Managed Care – PPO

## 2022-08-08 ENCOUNTER — Ambulatory Visit: Payer: BC Managed Care – PPO | Attending: Cardiovascular Disease | Admitting: Cardiovascular Disease

## 2022-08-08 DIAGNOSIS — R55 Syncope and collapse: Secondary | ICD-10-CM

## 2022-08-08 NOTE — Assessment & Plan Note (Signed)
Kristy Knight was referred to me by the [facility] emergency room for syncope/near syncope.  This occurred on 07/23/2022.  According to her family she had 4 episodes of syncope in 3-day..  She had dizziness in the past but never to this extent.  She does have ADHD and question Asperger's disease Asperger's s disease.  She does have an abnormal EKG with inferolateral T wave inversion and what appears to be the possible delta wave.  I worry about POTS versus other electrical anomalies such as WPW, Brugada etc. I am going to get a 2D echo, 2-week Zio patch and referral to Dr. Berton Mount for further evaluation.  She may need a tilt table test as well.  In the meantime, she is been counseled against driving for the next 6 months.

## 2022-08-08 NOTE — Progress Notes (Unsigned)
Enrolled for [employer] to mail a ZIO XT long term holter monitor to the patients address on file.  

## 2022-08-08 NOTE — Progress Notes (Signed)
08/08/2022 Kristy Knight   1996-02-18  [id number]  Primary Physician Debera Lat, PA-C Primary Cardiologist: Runell Gess MD Kristy Knight, [place]  HPI:  Kristy Knight is a 26 y.o. thin-appearing single Caucasian female referred by the [facility] ER for syncope.  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  She does vape, and is decreasing her caffeine intake.  She currently does not drink nor does she use illicit drugs by her account.  She is adopted and both of her birth parents were addicted to illicit drugs.  She was seen on 07/23/2022.  According to her family she had 4 episodes of syncope within the eight 3-day period.  She was in the middle of her menstrual period.  She has had palpitations as well.  Her work-up was unrevealing.  Her EKG does show changes that would suggest either structural or conduction issues such as WPW or Brugada.  We talked about not driving for 6 months.  I will refer her to Dr. Berton Mount for further evaluation and treatment.   Current Meds  Medication Sig   Acetaminophen (TYLENOL PO) Take 500 mg by mouth.   hydrOXYzine (ATARAX/VISTARIL) 25 MG tablet Take 1 tablet (25 mg total) by mouth 2 (two) times daily as needed for anxiety.   loratadine (CLARITIN) 10 MG tablet Take 10 mg by mouth daily.   Multiple Vitamin (MULTIVITAMIN) tablet Take 1 tablet by mouth daily.   sertraline (ZOLOFT) 100 MG tablet Take 1 tablet (100 mg total) by mouth daily.     No Known Allergies  Social History   Socioeconomic History   Marital status: Single    Spouse name: Not on file   Number of children: Not on file   Years of education: Not on file   Highest education level: Not on file  Occupational History   Not on file  Tobacco Use   Smoking status: Every Day    Packs/day: 1.00    Types: Cigarettes   Smokeless tobacco: Never  Vaping Use   Vaping Use: Never used  Substance and Sexual Activity   Alcohol use: No    Drug use: No   Sexual activity: Not Currently  Other Topics Concern   Not on file  Social History Narrative   Not on file   Social Determinants of Health   Financial Resource Strain: Not on file  Food Insecurity: Not on file  Transportation Needs: Not on file  Physical Activity: Not on file  Stress: Not on file  Social Connections: Not on file  Intimate Partner Violence: Not on file     Review of Systems: General: negative for chills, fever, night sweats or weight changes.  Cardiovascular: negative for chest pain, dyspnea on exertion, edema, orthopnea, palpitations, paroxysmal nocturnal dyspnea or shortness of breath Dermatological: negative for rash Respiratory: negative for cough or wheezing Urologic: negative for hematuria Abdominal: negative for nausea, vomiting, diarrhea, bright red blood per rectum, melena, or hematemesis Neurologic: negative for visual changes, syncope, or dizziness All other systems reviewed and are otherwise negative except as noted above.    Blood pressure 110/84, pulse 77, height 5' 2.5" (1.588 m), weight 129 lb 12.8 oz (58.9 kg), last menstrual period 07/19/2022, SpO2 99 %.  General appearance: alert and no distress Neck: no adenopathy, no carotid bruit, no JVD, supple, symmetrical, trachea midline, and thyroid not enlarged, symmetric, no tenderness/mass/nodules Lungs: clear to auscultation bilaterally Heart: regular rate and rhythm, S1, S2  normal, no murmur, click, rub or gallop Extremities: extremities normal, atraumatic, no cyanosis or edema Pulses: 2+ and symmetric Skin: Skin color, texture, turgor normal. No rashes or lesions Neurologic: Grossly normal  EKG sinus rhythm at 77 with inferolateral T wave inversion and what appears to be a delta wave.  I personally reviewed this EKG.  ASSESSMENT AND PLAN:   Near syncope Ms. Kipnis was referred to me by the [facility] emergency room for syncope/near syncope.  This occurred on  07/23/2022.  According to her family she had 4 episodes of syncope in 3-day..  She had dizziness in the past but never to this extent.  She does have ADHD and question Asperger's disease Asperger's s disease.  She does have an abnormal EKG with inferolateral T wave inversion and what appears to be the possible delta wave.  I worry about POTS versus other electrical anomalies such as WPW, Brugada etc. I am going to get a 2D echo, 2-week Zio patch and referral to Dr. Berton Mount for further evaluation.  She may need a tilt table test as well.  In the meantime, she is been counseled against driving for the next 6 months.     Runell Gess MD FACP,FACC,FAHA, [facility] 08/08/2022 10:12 AM

## 2022-08-08 NOTE — Patient Instructions (Signed)
Medication Instructions:  Your physician recommends that you continue on your current medications as directed. Please refer to the Current Medication list given to you today.  *If you need a refill on your cardiac medications before your next appointment, please call your pharmacy*    Testing/Procedures: Your physician has requested that you have an echocardiogram. Echocardiography is a painless test that uses sound waves to create images of your heart. It provides your doctor with information about the size and shape of your heart and how well your heart's chambers and valves are working. This procedure takes approximately one hour. There are no restrictions for this procedure. This procedure will be done at [address]   ZIO XT- Long Term Monitor Instructions  Your physician has requested you wear a ZIO patch monitor for 14 days.  This is a single patch monitor. [employer] supplies one patch monitor per enrollment. Additional stickers are not available. Please do not apply patch if you will be having a Nuclear Stress Test,  Echocardiogram, Cardiac CT, MRI, or Chest Xray during the period you would be wearing the  monitor. The patch cannot be worn during these tests. You cannot remove and re-apply the  ZIO XT patch monitor.  Your ZIO patch monitor will be mailed 3 day USPS to your address on file. It may take 3-5 days  to receive your monitor after you have been enrolled.  Once you have received your monitor, please review the enclosed instructions. Your monitor  has already been registered assigning a specific monitor serial # to you.  Billing and Patient Assistance Program Information  We have supplied [employer] with any of your insurance information on file for billing purposes. [employer] offers a sliding scale Patient Assistance Program for patients that do not have  insurance, or whose insurance does not completely cover the cost of the ZIO monitor.  You must apply for the  Patient Assistance Program to qualify for this discounted rate.  To apply, please call [employer] at [phone], select option 4, select option 2, ask to apply for  Patient Assistance Program. Meredeth Ide will ask your household income, and how many people  are in your household. They will quote your out-of-pocket cost based on that information.  [employer] will also be able to set up a 43-month, interest-free payment plan if needed.  Applying the monitor   Shave hair from upper left chest.  Hold abrader disc by orange tab. Rub abrader in 40 strokes over the upper left chest as  indicated in your monitor instructions.  Clean area with 4 enclosed alcohol pads. Let dry.  Apply patch as indicated in monitor instructions. Patch will be placed under collarbone on left  side of chest with arrow pointing upward.  Rub patch adhesive wings for 2 minutes. Remove white label marked "1". Remove the white  label marked "2". Rub patch adhesive wings for 2 additional minutes.  While looking in a mirror, press and release button in center of patch. A small green light will  flash 3-4 times. This will be your only indicator that the monitor has been turned on.  Do not shower for the first 24 hours. You may shower after the first 24 hours.  Press the button if you feel a symptom. You will hear a small click. Record Date, Time and  Symptom in the Patient Logbook.  When you are ready to remove the patch, follow instructions on the last 2 pages of Patient  Logbook. Stick patch  monitor onto the last page of Patient Logbook.  Place Patient Logbook in the blue and white box. Use locking tab on box and tape box closed  securely. The blue and white box has prepaid postage on it. Please place it in the mailbox as  soon as possible. Your physician should have your test results approximately 7 days after the  monitor has been mailed back to [facility].  Call [facility] Customer Care at [phone] if you have  questions regarding  your ZIO XT patch monitor. Call them immediately if you see an orange light blinking on your  monitor.  If your monitor falls off in less than 4 days, contact our Monitor department at [phone].  If your monitor becomes loose or falls off after 4 days call [employer] at [phone] for  suggestions on securing your monitor    Follow-Up: At [facility], you and your health needs are our priority.  As part of our continuing mission to provide you with exceptional heart care, we have created designated Provider Care Teams.  These Care Teams include your primary Cardiologist (physician) and Advanced Practice Providers (APPs -  Physician Assistants and Nurse Practitioners) who all work together to provide you with the care you need, when you need it.  We recommend signing up for the patient portal called "MyChart".  Sign up information is provided on this After Visit Summary.  MyChart is used to connect with patients for Virtual Visits (Telemedicine).  Patients are able to view lab/test results, encounter notes, upcoming appointments, etc.  Non-urgent messages can be sent to your provider as well.   To learn more about what you can do with MyChart, go to [email or web address].    Your next appointment:   3 month(s)  The format for your next appointment:   In Person  Provider:   Nanetta Batty, MD  Other Instructions  Near-Syncope Near-syncope is when you suddenly feel like you might pass out or faint, but you do not actually lose consciousness. This may also be referred to as presyncope. During an episode of near-syncope, you may: Feel dizzy, weak, light-headed, or like the room is spinning. Feel nauseous. See spots or see all white or all black in your field of vision. Have cold, clammy skin or feel warm and sweaty. Hear ringing in your ears (tinnitus). This condition is caused by a sudden decrease in blood flow to the brain. This decrease can result  from various causes, but most of those causes are not dangerous. However, near-syncope may be a sign of a serious medical problem, so it is important to seek medical care. Follow these instructions at home: Medicines Take over-the-counter and prescription medicines only as told by your health care provider. If you are taking blood pressure or heart medicine, get up slowly and take several minutes to sit and then stand. This can reduce dizziness and decrease the risk of near-syncope. Lifestyle Do not drive, use machinery, or play sports until your health care provider says it is okay. Do not drink alcohol. Do not use any products that contain nicotine or tobacco. These products include cigarettes, chewing tobacco, and vaping devices, such as e-cigarettes. If you need help quitting, ask your health care provider. Avoid hot tubs and saunas. General instructions Pay attention to any changes in your symptoms. Talk with your health care provider about your symptoms. You may need to have testing to understand the cause of your near-syncope. If you start to feel like you might faint, sit  or lie down right away. If sitting, put your head down between your legs. If lying down, raise (elevate) your feet above the level of your heart. Breathe deeply and steadily. Wait until all of the symptoms have passed. Have someone stay with you until you feel stable. Drink enough fluid to keep your urine pale yellow. Avoid prolonged standing. If you must stand for a long time, do movements such as: Moving your legs. Crossing your legs. Flexing and stretching your leg muscles. Squatting. Keep all follow-up visits. This is important. Contact a health care provider if: You continue to have episodes of near fainting. Get help right away if: You faint. You have any of these symptoms that may indicate trouble with your heart: Fast or irregular heartbeats (palpitations). Unusual pain in your chest, abdomen, or  back. Shortness of breath. You have a seizure. You have a severe headache. You are confused. You have vision problems. You have severe weakness or trouble walking. You are bleeding from your mouth or rectum, or have black or tarry stool. These symptoms may represent a serious problem that is an emergency. Do not wait to see if your symptoms will go away. Get medical help right away. Call your local emergency services (911 in the [place] Do not drive yourself to the hospital. Summary Near-syncope is when you suddenly feel like you might pass out or faint, but you do not actually lose consciousness. This condition is caused by a sudden decrease in blood flow to the brain. This decrease can result from various causes, but most of those causes are not dangerous. Near-syncope may be a sign of a serious medical problem, so it is important to seek medical care. If you start to feel like you might faint, sit or lie down right away. If sitting, put your head down between your legs. If lying down, raise (elevate) your feet above the level of your heart. Talk with your health care provider about your symptoms. You may need to have testing to understand the cause of your near-syncope. This information is not intended to replace advice given to you by your health care provider. Make sure you discuss any questions you have with your health care provider. Document Revised: 03/23/2021 Document Reviewed: 03/23/2021 Elsevier Patient Education  2023 [employer].

## 2022-09-03 ENCOUNTER — Encounter: Payer: Self-pay | Admitting: Internal Medicine

## 2022-09-03 ENCOUNTER — Ambulatory Visit: Payer: BC Managed Care – PPO | Attending: Internal Medicine | Admitting: Internal Medicine

## 2022-09-03 VITALS — BP 112/80 | HR 85 | Ht 62.5 in | Wt 136.0 lb

## 2022-09-03 DIAGNOSIS — R55 Syncope and collapse: Secondary | ICD-10-CM | POA: Diagnosis not present

## 2022-09-03 DIAGNOSIS — R9431 Abnormal electrocardiogram [ECG] [EKG]: Secondary | ICD-10-CM

## 2022-09-03 NOTE — Progress Notes (Signed)
ELECTROPHYSIOLOGY CONSULT NOTE  Patient ID: Kristy Knight, MRN: [id number], DOB/AGE: 27/14/1997 27 y.o. Admit date: (Not on file) Date of Consult: 09/03/2022  Primary Physician: Mardene Speak, PA-C Primary Cardiologist: Laurena Slimmer is a 26 y.o. female who is being seen today for the evaluation of JB  at the request of syncope.    HPI Kristy Knight is a 26 y.o. female referred for syncope.  She has a longstanding history of orthostatic and heat intolerance and over the last year has had increasing frequency of episodes of syncope characterized with a stereotypical prodrome of flushing, decreased vision and hearing,  followed by residual orthostatic intolerance, diaphoresis flushing and described as being extremely pale.  Prodrome can be variable depending on whether she has fluid replete which is seldom and fluid deplete which is more frequent.  Significant menses intolerance, heat intolerance, shower intolerance and post exertion intolerance  Past history of alcohol abuse but has been sober since January 2021, significant anxiety on longstanding Zoloft.  ECG is abnormal with P waves in the inferior leads  DATE TEST EF                    Date Cr K Hgb  8/23 0.7 3.2 11.8            Past Medical History:  Diagnosis Date   Anxiety       Surgical History: History reviewed. No pertinent surgical history.   Home Meds: Current Meds  Medication Sig   Acetaminophen (TYLENOL PO) Take 500 mg by mouth.   hydrOXYzine (ATARAX/VISTARIL) 25 MG tablet Take 1 tablet (25 mg total) by mouth 2 (two) times daily as needed for anxiety.   loratadine (CLARITIN) 10 MG tablet Take 10 mg by mouth daily.   Multiple Vitamin (MULTIVITAMIN) tablet Take 1 tablet by mouth daily.   sertraline (ZOLOFT) 100 MG tablet Take 1 tablet (100 mg total) by mouth daily.    Allergies: No Known Allergies  Social History   Socioeconomic History   Marital status: Single     Spouse name: Not on file   Number of children: Not on file   Years of education: Not on file   Highest education level: Not on file  Occupational History   Not on file  Tobacco Use   Smoking status: Every Day    Packs/day: 1.00    Types: Cigarettes   Smokeless tobacco: Never  Vaping Use   Vaping Use: Never used  Substance and Sexual Activity   Alcohol use: No   Drug use: No   Sexual activity: Not Currently  Other Topics Concern   Not on file  Social History Narrative   Not on file   Social Determinants of Health   Financial Resource Strain: Not on file  Food Insecurity: Not on file  Transportation Needs: Not on file  Physical Activity: Not on file  Stress: Not on file  Social Connections: Not on file  Intimate Partner Violence: Not on file     History reviewed. No pertinent family history.   ROS:  Please see the history of present illness.     All other systems reviewed and negative.    Physical Exam: Blood pressure 118/74, pulse 65, height 5' 2.5" (1.588 m), weight 136 lb (61.7 kg), last menstrual period 08/19/2022, SpO2 99 %. General: Well developed, well nourished female in no acute distress. Head: Normocephalic, atraumatic, sclera non-icteric, no xanthomas, nares  are without discharge. EENT: normal  Lymph Nodes:  none Neck: Negative for carotid bruits. JVD not elevated. Back:without scoliosis kyphosis Lungs: Clear bilaterally to auscultation without wheezes, rales, or rhonchi. Breathing is unlabored. Heart: RRR with S1 S2. No murmur . No rubs, or gallops appreciated. Abdomen: Soft, non-tender, non-distended with normoactive bowel sounds. No hepatomegaly. No rebound/guarding. No obvious abdominal masses. Msk:  Strength and tone appear normal for age. Extremities: No clubbing or cyanosis. No edema.  Distal pedal pulses are 2+ and equal bilaterally. Skin: Warm and Dry Neuro: Alert and oriented X 3. CN III-XII intact Grossly normal sensory and motor function  . Psych:  Responds to questions appropriately with a normal affect.        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  We discussed the role of salt and water repletion, the importance of exercise, often needing to be started in the recumbent position, and the awareness of triggers and the role of ambient heat and dehydration and hormonal suppression of menses  Recommended salt supplementation.  The goal is about 2 g of sodium, not sodium chloride, a day.  Salt supplements include oral ThermaTabs, buffered sodium preparation, SaltStick Vitassium,  Other options include NUUN.  This comes in pill form and is dissolved in liquids.  Other liquid preparations include liquid IV, Pedialyte advance care, TRI-oral   Her events have all occurred while standing.  Hence, with aggressive fluid and salt repletion I think it is reasonable for her to return to work and driving if she is without symptoms for total of 8 weeks.  Stressed the importance of "soul care"  the consequences of chronic illness      Sherryl Manges

## 2022-09-03 NOTE — Patient Instructions (Signed)
Medication Instructions:  Your physician recommends that you continue on your current medications as directed. Please refer to the Current Medication list given to you today.  *If you need a refill on your cardiac medications before your next appointment, please call your pharmacy*   Lab Work: None ordered.  If you have labs (blood work) drawn today and your tests are completely normal, you will receive your results only by: MyChart Message (if you have MyChart) OR A paper copy in the mail If you have any lab test that is abnormal or we need to change your treatment, we will call you to review the results.   Testing/Procedures: None ordered.    Follow-Up: At [facility], you and your health needs are our priority.  As part of our continuing mission to provide you with exceptional heart care, we have created designated Provider Care Teams.  These Care Teams include your primary Cardiologist (physician) and Advanced Practice Providers (APPs -  Physician Assistants and Nurse Practitioners) who all work together to provide you with the care you need, when you need it.  We recommend signing up for the patient portal called "MyChart".  Sign up information is provided on this After Visit Summary.  MyChart is used to connect with patients for Virtual Visits (Telemedicine).  Patients are able to view lab/test results, encounter notes, upcoming appointments, etc.  Non-urgent messages can be sent to your provider as well.   To learn more about what you can do with MyChart, go to [email or web address].    Your next appointment:   6 months with Dr Klein  Important Information About Sugar       

## 2022-09-04 ENCOUNTER — Ambulatory Visit: Payer: BC Managed Care – PPO | Attending: Cardiovascular Disease

## 2022-09-04 DIAGNOSIS — R55 Syncope and collapse: Secondary | ICD-10-CM | POA: Diagnosis not present

## 2022-09-04 LAB — ECHOCARDIOGRAM COMPLETE
AR max vel: 2.54 cm2
AV Area VTI: 2.49 cm2
AV Area mean vel: 2.47 cm2
AV Mean grad: 3 mmHg
AV Peak grad: 5.3 mmHg
Ao pk vel: 1.15 m/s
Area-P 1/2: 3.17 cm2
Calc EF: 74 %
S' Lateral: 2.35 cm
Single Plane A2C EF: 72.5 %
Single Plane A4C EF: 76 %

## 2022-11-07 ENCOUNTER — Ambulatory Visit: Payer: BC Managed Care – PPO | Admitting: Cardiovascular Disease

## 2022-11-08 ENCOUNTER — Ambulatory Visit: Payer: BC Managed Care – PPO | Attending: Cardiovascular Disease | Admitting: Cardiovascular Disease

## 2022-11-08 ENCOUNTER — Encounter: Payer: Self-pay | Admitting: Cardiovascular Disease

## 2022-11-08 VITALS — BP 104/70 | HR 82 | Ht 63.0 in | Wt 142.6 lb

## 2022-11-08 DIAGNOSIS — F17200 Nicotine dependence, unspecified, uncomplicated: Secondary | ICD-10-CM | POA: Diagnosis not present

## 2022-11-08 DIAGNOSIS — R55 Syncope and collapse: Secondary | ICD-10-CM | POA: Diagnosis not present

## 2022-11-08 NOTE — Patient Instructions (Signed)
Medication Instructions:  The current medical regimen is effective;  continue present plan and medications.  *If you need a refill on your cardiac medications before your next appointment, please call your pharmacy*   Follow-Up: At [facility], you and your health needs are our priority.  As part of our continuing mission to provide you with exceptional heart care, we have created designated Provider Care Teams.  These Care Teams include your primary Cardiologist (physician) and Advanced Practice Providers (APPs -  Physician Assistants and Nurse Practitioners) who all work together to provide you with the care you need, when you need it.  We recommend signing up for the patient portal called "MyChart".  Sign up information is provided on this After Visit Summary.  MyChart is used to connect with patients for Virtual Visits (Telemedicine).  Patients are able to view lab/test results, encounter notes, upcoming appointments, etc.  Non-urgent messages can be sent to your provider as well.   To learn more about what you can do with MyChart, go to [email or web address].    Your next appointment:   6 month(s)  The format for your next appointment:   In Person  Provider:   Dr.Klein, and 12 months with Dr.Berry

## 2022-11-08 NOTE — Assessment & Plan Note (Signed)
No longer smokes cigarettes but does vape.  She uses occasional marijuana.

## 2022-11-08 NOTE — Assessment & Plan Note (Signed)
History of "near syncope" potentially in neurally mediated.  I did refer her to Dr. Graciela Husbands for evaluation of this.  He recommended salt repletion and beginning exercise in a supine position as well as hydration.  Her 2D echo was essentially normal and her event monitor did show some atrial tachycardia during which time she was symptomatic.  I recommended that she discontinue caffeine intake as well.  Both Dr. Graciela Husbands and I have cleared her to go back to work and drive.

## 2022-11-08 NOTE — Progress Notes (Signed)
11/08/2022 Kristy Knight   [birth year]  [id number]  Primary Physician Debera Lat, PA-C Primary Cardiologist: Runell Gess MD Kristy Knight, [place]  HPI:  Kristy Knight is a 26 y.o.  thin-appearing single Caucasian female referred by the [facility] ER for syncope. 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 She does vape, and is decreasing her caffeine intake. She currently does not drink nor does she use illicit drugs by her account. She is adopted and both of her birth parents were addicted to illicit drugs.  I last saw her in the office 08/08/2022. According to her family she had 4 episodes of syncope within the eight 3-day period. She was in the middle of her menstrual period. She has had palpitations as well. Her work-up was unrevealing. Her EKG does show changes that would suggest either structural or conduction issues such as WPW or Brugada. We talked about not driving for 6 months.  I did refer her to Dr. Berton Mount who saw her 09/03/2022.  He thought that her syncope was neurally mediated with heat intolerance, shower intolerance and menses intolerance.  He discussed dysautonomia.  Since I saw her 3 months ago her episodes of dizziness have become less frequent and severe.  She did have a 2D echo that was essentially normal and an event monitor that showed occasional PACs with some atrial tachycardia associated with her symptoms.  Current Meds  Medication Sig   Acetaminophen (TYLENOL PO) Take 500 mg by mouth.   hydrOXYzine (ATARAX/VISTARIL) 25 MG tablet Take 1 tablet (25 mg total) by mouth 2 (two) times daily as needed for anxiety.   loratadine (CLARITIN) 10 MG tablet Take 10 mg by mouth daily.   Multiple Vitamin (MULTIVITAMIN) tablet Take 1 tablet by mouth daily.   sertraline (ZOLOFT) 100 MG tablet Take 1 tablet (100 mg total) by mouth daily.     No Known Allergies  Social History   Socioeconomic History   Marital status:  Single    Spouse name: Not on file   Number of children: Not on file   Years of education: Not on file   Highest education level: Not on file  Occupational History   Not on file  Tobacco Use   Smoking status: Every Day    Packs/day: 1.00    Types: Cigarettes   Smokeless tobacco: Never  Vaping Use   Vaping Use: Never used  Substance and Sexual Activity   Alcohol use: No   Drug use: No   Sexual activity: Not Currently  Other Topics Concern   Not on file  Social History Narrative   Not on file   Social Determinants of Health   Financial Resource Strain: Not on file  Food Insecurity: Not on file  Transportation Needs: Not on file  Physical Activity: Not on file  Stress: Not on file  Social Connections: Not on file  Intimate Partner Violence: Not on file     Review of Systems: General: negative for chills, fever, night sweats or weight changes.  Cardiovascular: negative for chest pain, dyspnea on exertion, edema, orthopnea, palpitations, paroxysmal nocturnal dyspnea or shortness of breath Dermatological: negative for rash Respiratory: negative for cough or wheezing Urologic: negative for hematuria Abdominal: negative for nausea, vomiting, diarrhea, bright red blood per rectum, melena, or hematemesis Neurologic: negative for visual changes, syncope, or dizziness All other systems reviewed and are otherwise negative except as noted above.    Blood pressure 104/70, pulse  82, height 5\' 3"  (1.6 m), weight 142 lb 9.6 oz (64.7 kg), SpO2 100 %.  General appearance: alert and no distress Neck: no adenopathy, no carotid bruit, no JVD, supple, symmetrical, trachea midline, and thyroid not enlarged, symmetric, no tenderness/mass/nodules Lungs: clear to auscultation bilaterally Heart: regular rate and rhythm, S1, S2 normal, no murmur, click, rub or gallop Extremities: extremities normal, atraumatic, no cyanosis or edema Pulses: 2+ and symmetric Skin: Skin color, texture, turgor  normal. No rashes or lesions Neurologic: Grossly normal  EKG not performed today  ASSESSMENT AND PLAN:   Smoker No longer smokes cigarettes but does vape.  She uses occasional marijuana.  Near syncope History of "near syncope" potentially in neurally mediated.  I did refer her to Dr. Caryl Comes for evaluation of this.  He recommended salt repletion and beginning exercise in a supine position as well as hydration.  Her 2D echo was essentially normal and her event monitor did show some atrial tachycardia during which time she was symptomatic.  I recommended that she discontinue caffeine intake as well.  Both Dr. Caryl Comes and I have cleared her to go back to work and drive.     Lorretta Harp MD FACP,FACC,FAHA, [facility] 11/08/2022 11:21 AM

## 2022-11-12 ENCOUNTER — Encounter: Payer: Self-pay | Admitting: Cardiovascular Disease

## 2022-11-13 ENCOUNTER — Telehealth: Payer: Self-pay | Admitting: Cardiovascular Disease

## 2022-11-13 NOTE — Telephone Encounter (Signed)
patient states that she is bringing paperwork to our office that have to be completed today. She states it fmla so that she can go back to work. Please advise

## 2022-11-13 NOTE — Telephone Encounter (Signed)
Patient sent message through [facility] today. It was addressed by Dr Allyson Sabal 's Nurse

## 2023-03-27 DIAGNOSIS — Z3400 Encounter for supervision of normal first pregnancy, unspecified trimester: Secondary | ICD-10-CM | POA: Diagnosis not present

## 2023-03-27 DIAGNOSIS — Z31430 Encounter of female for testing for genetic disease carrier status for procreative management: Secondary | ICD-10-CM | POA: Diagnosis not present

## 2023-04-05 LAB — PANORAMA PRENATAL TEST FULL PANEL:PANORAMA TEST PLUS 5 ADDITIONAL MICRODELETIONS: FETAL FRACTION: 8.1

## 2023-04-07 ENCOUNTER — Other Ambulatory Visit: Payer: Self-pay

## 2023-04-07 ENCOUNTER — Emergency Department
Admission: EM | Admit: 2023-04-07 | Discharge: 2023-04-07 | Disposition: A | Discharge status: Home / Self Care | Payer: BC Managed Care – PPO | Attending: Emergency Medicine | Admitting: Emergency Medicine

## 2023-04-07 DIAGNOSIS — Z3A13 13 weeks gestation of pregnancy: Secondary | ICD-10-CM | POA: Diagnosis not present

## 2023-04-07 DIAGNOSIS — O26811 Pregnancy related exhaustion and fatigue, first trimester: Secondary | ICD-10-CM | POA: Insufficient documentation

## 2023-04-07 DIAGNOSIS — R55 Syncope and collapse: Secondary | ICD-10-CM | POA: Diagnosis not present

## 2023-04-07 LAB — CBC
HCT: 37.9 % (ref 36.0–46.0)
Hemoglobin: 13.2 g/dL (ref 12.0–15.0)
MCH: 30.3 pg (ref 26.0–34.0)
MCHC: 34.8 g/dL (ref 30.0–36.0)
MCV: 86.9 fL (ref 80.0–100.0)
Platelets: 183 10*3/uL (ref 150–400)
RBC: 4.36 MIL/uL (ref 3.87–5.11)
RDW: 11.9 % (ref 11.5–15.5)
WBC: 11.8 10*3/uL — ABNORMAL HIGH (ref 4.0–10.5)
nRBC: 0 % (ref 0.0–0.2)

## 2023-04-07 LAB — URINALYSIS, ROUTINE W REFLEX MICROSCOPIC
Glucose, UA: NEGATIVE mg/dL
Hgb urine dipstick: NEGATIVE
Ketones, ur: >160 mg/dL — AB
Leukocytes,Ua: NEGATIVE
Nitrite: NEGATIVE
Protein, ur: NEGATIVE mg/dL
Specific Gravity, Urine: 1.025 (ref 1.005–1.030)
pH: 6 (ref 5.0–8.0)

## 2023-04-07 LAB — POC URINE PREG, ED: Preg Test, Ur: POSITIVE — AB

## 2023-04-07 LAB — BASIC METABOLIC PANEL
Anion gap: 11 (ref 5–15)
BUN: 16 mg/dL (ref 6–20)
CO2: 18 mmol/L — ABNORMAL LOW (ref 22–32)
Calcium: 9.7 mg/dL (ref 8.9–10.3)
Chloride: 102 mmol/L (ref 98–111)
Creatinine, Ser: 0.66 mg/dL (ref 0.44–1.00)
GFR, Estimated: >60 mL/min (ref >60)
Glucose, Bld: 111 mg/dL — ABNORMAL HIGH (ref 70–99)
Potassium: 3.8 mmol/L (ref 3.5–5.1)
Sodium: 131 mmol/L — ABNORMAL LOW (ref 135–145)

## 2023-04-07 LAB — CBG MONITORING, ED: Glucose-Capillary: 100 mg/dL — ABNORMAL HIGH (ref 70–99)

## 2023-04-07 MED ORDER — DEXTROSE-NACL 5-0.9 % IV SOLN
1000.0000 mL | Freq: Once | INTRAVENOUS | Status: AC
  Administered 2023-04-07: 1000 mL via INTRAVENOUS

## 2023-04-07 MED ORDER — METOCLOPRAMIDE HCL 5 MG/ML IJ SOLN
10.0000 mg | Freq: Once | INTRAMUSCULAR | Status: AC
  Administered 2023-04-07: 10 mg via INTRAVENOUS
  Filled 2023-04-07: qty 2

## 2023-04-07 MED ORDER — LACTATED RINGERS IV BOLUS
1000.0000 mL | Freq: Once | INTRAVENOUS | Status: AC
  Administered 2023-04-07: 1000 mL via INTRAVENOUS

## 2023-04-07 NOTE — ED Provider Notes (Addendum)
[facility] Provider Note    Event Date/Time   First MD Initiated Contact with Patient 04/07/23 1050     (approximate)   History   Near Syncope   HPI  Kristy Knight is a 27 y.o. female past medical history of syncope, POTS approximately [redacted] weeks pregnant who presents after episode of near syncope.  Patient was at work at [employer] she had been standing for about an hour when she suddenly felt very warm and lightheaded.  She does not think she actually lost consciousness and her coworker who is at bedside states that she was awake during the whole episode but she did fall to the ground.  She has had multiple similar episodes of syncope/presyncope in the past.  She is about [redacted] weeks pregnant.  Has had ultrasound during this pregnancy.  Denies vaginal bleeding urinary symptoms or abdominal pain.  She has had significant morning sickness and is vomiting multiple times per day.  Her OB/GYN prescribed her Diclegis but she has not yet picked it up.  Denies fevers or chills.  She tells me that she has chronic chest pain and dyspnea related to her POTS this is unchanged today.  Denies lower extremity swelling hemoptysis or history of PE.     Past Medical History:  Diagnosis Date   Anxiety     Patient Active Problem List   Diagnosis Date Noted   Near syncope 07/24/2022   MDD (major depressive disorder), recurrent, in partial remission (HCC) 01/03/2021   Skin lesion of back 04/11/2020   Atypical pigmented skin lesion 04/11/2020   Acne vulgaris 04/11/2020   Seasonal allergic rhinitis due to pollen 04/11/2020   Thrombocytopenia (HCC) 04/11/2020   MDD (major depressive disorder), recurrent episode, moderate (HCC) 02/04/2020   Alcohol use disorder, mild, in sustained remission 02/04/2020   Vitamin D deficiency 01/18/2020   History of risk factor for suicide 01/18/2020   Autism spectrum disorder 01/18/2020   Anxiety 01/18/2020   Depression, major, single episode,  severe (HCC) 01/18/2020   Screening for thyroid disorder 01/18/2020   Smoker 01/18/2020     Physical Exam  Triage Vital Signs: ED Triage Vitals  Enc Vitals Group     BP 04/07/23 0822 98/72     Pulse Rate 04/07/23 0822 82     Resp 04/07/23 0822 18     Temp 04/07/23 0822 97.8 F (36.6 C)     Temp src --      SpO2 04/07/23 0822 100 %     Weight 04/07/23 0824 138 lb (62.6 kg)     Height 04/07/23 0824 5' 2.5" (1.588 m)     Head Circumference --      Peak Flow --      Pain Score 04/07/23 0823 0     Pain Loc --      Pain Edu? --      Excl. in GC? --     Most recent vital signs: Vitals:   04/07/23 1127 04/07/23 1324  BP: 98/70 93/66  Pulse: 80 84  Resp: 18 18  Temp: 97.7 F (36.5 C) 97.7 F (36.5 C)  SpO2: 100% 100%     General: Awake, no distress.  CV:  Good peripheral perfusion. No lower extremity edema Resp:  Normal effort.  Abd:  No distention.  Abdomen is soft nontender Neuro:             Awake, Alert, Oriented x 3  Other:     ED Results /  Procedures / Treatments  Labs (all labs ordered are listed, but only abnormal results are displayed) Labs Reviewed  BASIC METABOLIC PANEL - Abnormal; Notable for the following components:      Result Value   Sodium 131 (*)    CO2 18 (*)    Glucose, Bld 111 (*)    All other components within normal limits  CBC - Abnormal; Notable for the following components:   WBC 11.8 (*)    All other components within normal limits  URINALYSIS, ROUTINE W REFLEX MICROSCOPIC - Abnormal; Notable for the following components:   Bilirubin Urine SMALL (*)    Ketones, ur >160 (*)    All other components within normal limits  CBG MONITORING, ED - Abnormal; Notable for the following components:   Glucose-Capillary 100 (*)    All other components within normal limits  POC URINE PREG, ED - Abnormal; Notable for the following components:   Preg Test, Ur Positive (*)    All other components within normal limits     EKG  EKG reviewed and  interpreted by myself shows abnormal P wave axis including inverted T waves inferiorly with shortened PR interval possible delta wave normal QRS axis and intervals no acute ischemic changes, appears similar to prior EKG  RADIOLOGY   PROCEDURES:  Critical Care performed: No  Procedures  The patient is on the cardiac monitor to evaluate for evidence of arrhythmia and/or significant heart rate changes.   MEDICATIONS ORDERED IN ED: Medications  lactated ringers bolus 1,000 mL (0 mLs Intravenous Stopped 04/07/23 1324)  metoCLOPramide (REGLAN) injection 10 mg (10 mg Intravenous Given 04/07/23 1126)  dextrose 5 %-0.9 % sodium chloride infusion (1,000 mLs Intravenous New Bag/Given 04/07/23 1424)     IMPRESSION / MDM / ASSESSMENT AND PLAN / ED COURSE  I reviewed the triage vital signs and the nursing notes.                              Patient's presentation is most consistent with acute complicated illness / injury requiring diagnostic workup.  Differential diagnosis includes, but is not limited to, vasovagal episode, orthostatic hypotension, dehydration, electrolyte abnormality, less likely arrhythmia, PE  Patient is a 27 year old female with history of POTS multiple syncopal episodes that were thought to be neurally mediated who presents today after an episode of presyncope at work.  She had been standing had prodrome of warmth and lightheadedness and then did nearly lose consciousness but did not actually syncopized.  She is [redacted] weeks pregnant has not had any vaginal bleeding has had ultrasound during this pregnancy that was normal by report.  She has had significant vomiting during this pregnancy is not eating much.  Also tells me that she has chronic chest pain and dyspnea related to her POTS with is unchanged today.  Did not have any palpitations prior to the episode of presyncope today.  Patient's blood pressure is somewhat soft but she is not hypoxic with normal heart rate.  She looks  well on exam abdominal exam is benign no signs of DVT.  First EKG has significant artifact so we will repeat.  Do see that patient has seen cardiology in the past had echo and worn Holter monitor that showed atrial tachycardia.  Her episodes of syncope were thought to be neurally mediated.  Labs including CBC and BMP are notable for mild hyponatremia and bicarb of 18.  Urinalysis pending.  Suspect mild dehydration likely  exacerbating her underlying proclivity to syncope.  Will give a bolus of fluid and IV Reglan.  Will repeat EKG.  Patient's repeat EKG with less artifact does show an abnormal P wave axis there is inverted P waves inferiorly with somewhat of a shortened PR interval question delta wave.  This does appear almost identical to prior EKG for which she saw cardiology for and there is some question for WPW.  Overall today this event does not sound cardiac specially given her prodrome do suspect that this was vasovagal.  Patient feeling improved after bolus of fluid.  Her blood pressure is soft but she is asymptomatic is not lightheaded upon standing.  Urinalysis does show significant ketones.  Bicarb is 18.  Suspect this is in the setting of her hyperemesis.  Will give a bolus of D5 NS but patient is not vomiting here and she is tolerating p.o.  Patient continues to feel well after D5 NS bolus.  She is tolerating p.o.  She is not symptomatic with standing or walking.  Will discharge.  Discussed return precautions.     FINAL CLINICAL IMPRESSION(S) / ED DIAGNOSES   Final diagnoses:  Near syncope     Rx / DC Orders   ED Discharge Orders     None        Note:  This document was prepared using Dragon voice recognition software and may include unintentional dictation errors.   Georga Hacking, MD 04/07/23 1538    Georga Hacking, MD 04/07/23 1538

## 2023-04-07 NOTE — Discharge Instructions (Signed)
Please pick up the Diclegis that your OB/GYN prescribed.  Please make sure you are standing up slowly and staying hydrated.  If you are not able to keep fluids down despite the Diclegis then please return to the emergency department.

## 2023-04-07 NOTE — ED Triage Notes (Addendum)
Pt come in with [facility] after a near sycopal episode. Pt has history of POTS, hypotension and bigeminey.   Pt reports 13 weeks pregnancy and shortness of breath, which she states is her normal POTS shortness of breath.    For EMS VSS.   18G right AC placed by EMS

## 2023-08-02 DIAGNOSIS — O219 Vomiting of pregnancy, unspecified: Secondary | ICD-10-CM | POA: Diagnosis not present

## 2023-08-02 DIAGNOSIS — N898 Other specified noninflammatory disorders of vagina: Secondary | ICD-10-CM | POA: Diagnosis not present

## 2023-08-15 ENCOUNTER — Encounter: Payer: Self-pay | Admitting: Obstetrics and Gynecology

## 2023-08-15 ENCOUNTER — Other Ambulatory Visit: Payer: Self-pay

## 2023-08-15 ENCOUNTER — Observation Stay
Admission: EM | Admit: 2023-08-15 | Discharge: 2023-08-15 | Disposition: A | Discharge status: Home / Self Care | Payer: BC Managed Care – PPO | Attending: Obstetrics and Gynecology | Admitting: Obstetrics and Gynecology

## 2023-08-15 DIAGNOSIS — O26893 Other specified pregnancy related conditions, third trimester: Secondary | ICD-10-CM | POA: Diagnosis present

## 2023-08-15 DIAGNOSIS — F17210 Nicotine dependence, cigarettes, uncomplicated: Secondary | ICD-10-CM | POA: Insufficient documentation

## 2023-08-15 DIAGNOSIS — O99353 Diseases of the nervous system complicating pregnancy, third trimester: Secondary | ICD-10-CM | POA: Diagnosis not present

## 2023-08-15 DIAGNOSIS — Z3A31 31 weeks gestation of pregnancy: Secondary | ICD-10-CM | POA: Insufficient documentation

## 2023-08-15 DIAGNOSIS — R519 Headache, unspecified: Secondary | ICD-10-CM | POA: Diagnosis not present

## 2023-08-15 DIAGNOSIS — O99333 Smoking (tobacco) complicating pregnancy, third trimester: Secondary | ICD-10-CM | POA: Diagnosis not present

## 2023-08-15 DIAGNOSIS — R109 Unspecified abdominal pain: Secondary | ICD-10-CM | POA: Diagnosis not present

## 2023-08-15 DIAGNOSIS — O479 False labor, unspecified: Principal | ICD-10-CM | POA: Diagnosis present

## 2023-08-15 DIAGNOSIS — G90A Postural orthostatic tachycardia syndrome (POTS): Secondary | ICD-10-CM | POA: Diagnosis not present

## 2023-08-15 HISTORY — DX: Tuberculosis of spine: A18.01

## 2023-08-15 LAB — COMPREHENSIVE METABOLIC PANEL
ALT: 18 U/L (ref 0–44)
AST: 26 U/L (ref 15–41)
Albumin: 2.8 g/dL — ABNORMAL LOW (ref 3.5–5.0)
Alkaline Phosphatase: 99 U/L (ref 38–126)
Anion gap: 9 (ref 5–15)
BUN: 6 mg/dL (ref 6–20)
CO2: 25 mmol/L (ref 22–32)
Calcium: 8.7 mg/dL — ABNORMAL LOW (ref 8.9–10.3)
Chloride: 102 mmol/L (ref 98–111)
Creatinine, Ser: 0.47 mg/dL (ref 0.44–1.00)
GFR, Estimated: >60 mL/min (ref >60)
Glucose, Bld: 88 mg/dL (ref 70–99)
Potassium: 3.7 mmol/L (ref 3.5–5.1)
Sodium: 136 mmol/L (ref 135–145)
Total Bilirubin: 1 mg/dL (ref 0.3–1.2)
Total Protein: 6.3 g/dL — ABNORMAL LOW (ref 6.5–8.1)

## 2023-08-15 LAB — PROTEIN / CREATININE RATIO, URINE
Creatinine, Urine: 38 mg/dL
Total Protein, Urine: <6 mg/dL

## 2023-08-15 LAB — CBC
HCT: 31 % — ABNORMAL LOW (ref 36.0–46.0)
Hemoglobin: 10.4 g/dL — ABNORMAL LOW (ref 12.0–15.0)
MCH: 29.5 pg (ref 26.0–34.0)
MCHC: 33.5 g/dL (ref 30.0–36.0)
MCV: 88.1 fL (ref 80.0–100.0)
Platelets: 139 10*3/uL — ABNORMAL LOW (ref 150–400)
RBC: 3.52 MIL/uL — ABNORMAL LOW (ref 3.87–5.11)
RDW: 12.2 % (ref 11.5–15.5)
WBC: 10.2 10*3/uL (ref 4.0–10.5)
nRBC: 0 % (ref 0.0–0.2)

## 2023-08-15 MED ORDER — ZOLPIDEM TARTRATE 5 MG PO TABS: 5.0000 mg | ORAL_TABLET | Freq: Every evening | ORAL | Status: DC | PRN

## 2023-08-15 MED ORDER — PRENATAL MULTIVITAMIN CH: 1.0000 | ORAL_TABLET | Freq: Every day | ORAL | Status: DC

## 2023-08-15 MED ORDER — LACTATED RINGERS IV SOLN: 125.0000 mL/h | INTRAVENOUS | Status: DC

## 2023-08-15 MED ORDER — DOCUSATE SODIUM 100 MG PO CAPS: 100.0000 mg | ORAL_CAPSULE | Freq: Every day | ORAL | Status: DC

## 2023-08-15 MED ORDER — ACETAMINOPHEN 325 MG PO TABS: 650.0000 mg | ORAL_TABLET | ORAL | Status: DC | PRN

## 2023-08-15 MED ORDER — CALCIUM CARBONATE ANTACID 500 MG PO CHEW: 2.0000 | CHEWABLE_TABLET | ORAL | Status: DC | PRN

## 2023-08-15 NOTE — OB Triage Note (Addendum)
Pt arrived G1P0 with c/o abd pain, ctx's, high BP, dizziness, and blurry vision. Pt has POTTS and says that these occur about once a week. Pt is a [facility] patient due to high risk. Pt is currently on Flafyll for BV and has been on continuous antibiotics throughout her pregnancy for UTIS and yeast infections as well.

## 2023-08-15 NOTE — Discharge Summary (Signed)
Kristy Knight is a 27 y.o. female. She is at [redacted]w[redacted]d gestation. Patient's last menstrual period was 01/05/2023. Estimated Date of Delivery: 10/12/23  Prenatal care site:  [facility]  Chief complaint: abdominal pain, HA, blurred vision, and elevated BP  HPI: [name] presents to L&D with complaints of abdominal pain, HA, blurred vision, and elevated BP  Factors complicating pregnancy: POTS Thrombocytopenia hyperemesis  S: Resting comfortably. no CTX, no VB.no LOF,  Active fetal movement.   Maternal Medical History:  Past Medical Hx:  has a past medical history of Anxiety and Pott's disease.    Past Surgical Hx:  has no past surgical history on file.   No Known Allergies   Prior to Admission medications   Medication Sig Start Date End Date Taking? Authorizing Provider  Multiple Vitamin (MULTIVITAMIN) tablet Take 1 tablet by mouth daily.   Yes [provider]  sertraline (ZOLOFT) 100 MG tablet Take 1 tablet (100 mg total) by mouth daily. 03/28/21  Yes Zena Amos, MD  Acetaminophen (TYLENOL PO) Take 500 mg by mouth.    [provider]  hydrOXYzine (ATARAX/VISTARIL) 25 MG tablet Take 1 tablet (25 mg total) by mouth 2 (two) times daily as needed for anxiety. 03/28/21   Zena Amos, MD  loratadine (CLARITIN) 10 MG tablet Take 10 mg by mouth daily.    [provider]    Social History: She  reports that she has been smoking cigarettes. She has never used smokeless tobacco. She reports that she does not drink alcohol and does not use drugs.  Family History: family history is not on file. ,no history of gyn cancers  Review of Systems: A full review of systems was performed and negative except as noted in the HPI.    O:  BP 116/77   Pulse 91   Temp 97.9 F (36.6 C) (Oral)   Resp 18   Ht 5' 2.5" (1.588 m)   Wt 68 kg   LMP 01/05/2023   BMI 27.00 kg/m  No results found for this or any previous visit (from the past 48 hour(s)).   Constitutional: NAD,  AAOx3  HE/ENT: extraocular movements grossly intact, moist mucous membranes CV: RRR PULM: nl respiratory effort, CTABL Abd: gravid, non-tender, non-distended, soft  Ext: Non-tender, Nonedmeatous Psych: mood appropriate, speech normal Pelvic : deferred SVE:     Fetal Monitor: Baseline: 130 bpm Variability: moderate Accels: Present Decels: none Toco: none  Category: I   Assessment: 27 y.o. [redacted]w[redacted]d here for antenatal surveillance during pregnancy.  Principle diagnosis:   Plan: Labor: not present.  Fetal Wellbeing: Reassuring Cat 1 tracing. Reactive NST  PIH labs WNL D/c home stable, precautions reviewed, follow-up as scheduled.   ----- Chari Manning, CNM Certified Nurse Midwife [facility]

## 2023-08-30 DIAGNOSIS — Z3400 Encounter for supervision of normal first pregnancy, unspecified trimester: Secondary | ICD-10-CM | POA: Diagnosis not present

## 2023-10-12 DIAGNOSIS — O480 Post-term pregnancy: Secondary | ICD-10-CM | POA: Diagnosis not present

## 2023-10-12 DIAGNOSIS — O0993 Supervision of high risk pregnancy, unspecified, third trimester: Secondary | ICD-10-CM | POA: Diagnosis not present

## 2023-10-12 DIAGNOSIS — Z87891 Personal history of nicotine dependence: Secondary | ICD-10-CM | POA: Diagnosis not present

## 2023-10-12 DIAGNOSIS — G90A Postural orthostatic tachycardia syndrome (POTS): Secondary | ICD-10-CM | POA: Diagnosis not present

## 2023-10-12 DIAGNOSIS — D62 Acute posthemorrhagic anemia: Secondary | ICD-10-CM | POA: Diagnosis not present

## 2023-10-12 DIAGNOSIS — D696 Thrombocytopenia, unspecified: Secondary | ICD-10-CM | POA: Diagnosis not present

## 2023-10-12 DIAGNOSIS — I9589 Other hypotension: Secondary | ICD-10-CM | POA: Diagnosis not present

## 2023-10-12 DIAGNOSIS — O9902 Anemia complicating childbirth: Secondary | ICD-10-CM | POA: Diagnosis not present

## 2023-10-12 DIAGNOSIS — F419 Anxiety disorder, unspecified: Secondary | ICD-10-CM | POA: Diagnosis not present

## 2023-10-12 DIAGNOSIS — I9788 Other intraoperative complications of the circulatory system, not elsewhere classified: Secondary | ICD-10-CM | POA: Diagnosis not present

## 2023-10-12 DIAGNOSIS — O1414 Severe pre-eclampsia complicating childbirth: Secondary | ICD-10-CM | POA: Diagnosis not present

## 2023-10-12 DIAGNOSIS — O9912 Other diseases of the blood and blood-forming organs and certain disorders involving the immune mechanism complicating childbirth: Secondary | ICD-10-CM | POA: Diagnosis not present

## 2023-10-12 DIAGNOSIS — O99354 Diseases of the nervous system complicating childbirth: Secondary | ICD-10-CM | POA: Diagnosis not present

## 2023-10-12 DIAGNOSIS — Z3A40 40 weeks gestation of pregnancy: Secondary | ICD-10-CM | POA: Diagnosis not present

## 2023-10-12 DIAGNOSIS — O9942 Diseases of the circulatory system complicating childbirth: Secondary | ICD-10-CM | POA: Diagnosis not present

## 2023-10-12 DIAGNOSIS — O99344 Other mental disorders complicating childbirth: Secondary | ICD-10-CM | POA: Diagnosis not present

## 2023-10-13 DIAGNOSIS — Z3A40 40 weeks gestation of pregnancy: Secondary | ICD-10-CM | POA: Diagnosis not present

## 2023-10-13 DIAGNOSIS — O0993 Supervision of high risk pregnancy, unspecified, third trimester: Secondary | ICD-10-CM | POA: Diagnosis not present

## 2023-10-13 DIAGNOSIS — O1414 Severe pre-eclampsia complicating childbirth: Secondary | ICD-10-CM | POA: Diagnosis not present

## 2023-11-13 DIAGNOSIS — R0600 Dyspnea, unspecified: Secondary | ICD-10-CM | POA: Diagnosis not present

## 2023-11-13 DIAGNOSIS — R002 Palpitations: Secondary | ICD-10-CM | POA: Diagnosis not present

## 2023-11-18 DIAGNOSIS — D696 Thrombocytopenia, unspecified: Secondary | ICD-10-CM | POA: Diagnosis not present

## 2023-11-18 DIAGNOSIS — R002 Palpitations: Secondary | ICD-10-CM | POA: Diagnosis not present

## 2023-11-18 DIAGNOSIS — O99119 Other diseases of the blood and blood-forming organs and certain disorders involving the immune mechanism complicating pregnancy, unspecified trimester: Secondary | ICD-10-CM | POA: Diagnosis not present

## 2024-02-11 ENCOUNTER — Ambulatory Visit (INDEPENDENT_AMBULATORY_CARE_PROVIDER_SITE_OTHER): Admitting: Physician Assistant

## 2024-02-11 ENCOUNTER — Encounter: Payer: Self-pay | Admitting: Physician Assistant

## 2024-02-11 VITALS — BP 139/87 | HR 79 | Temp 98.1°F | Resp 16 | Ht 62.0 in | Wt 172.0 lb

## 2024-02-11 DIAGNOSIS — F419 Anxiety disorder, unspecified: Secondary | ICD-10-CM

## 2024-02-11 DIAGNOSIS — R42 Dizziness and giddiness: Secondary | ICD-10-CM | POA: Diagnosis not present

## 2024-02-11 DIAGNOSIS — R0602 Shortness of breath: Secondary | ICD-10-CM

## 2024-02-11 DIAGNOSIS — R0789 Other chest pain: Secondary | ICD-10-CM

## 2024-02-11 DIAGNOSIS — R61 Generalized hyperhidrosis: Secondary | ICD-10-CM

## 2024-02-11 DIAGNOSIS — H53133 Sudden visual loss, bilateral: Secondary | ICD-10-CM

## 2024-02-11 DIAGNOSIS — Z7689 Persons encountering health services in other specified circumstances: Secondary | ICD-10-CM

## 2024-02-11 DIAGNOSIS — R55 Syncope and collapse: Secondary | ICD-10-CM | POA: Diagnosis not present

## 2024-02-11 DIAGNOSIS — R002 Palpitations: Secondary | ICD-10-CM

## 2024-02-11 MED ORDER — BUSPIRONE HCL 5 MG PO TABS: 5.0000 mg | ORAL_TABLET | Freq: Two times a day (BID) | ORAL | 1 refills | Status: DC

## 2024-02-11 NOTE — Progress Notes (Unsigned)
 New patient visit  Patient: Kristy Knight   DOB: 1996-07-24   27 y.o. Female  MRN: [id number] Visit Date: 02/11/2024  Today's healthcare provider: Debera Lat, PA-C   Chief Complaint  Patient presents with   Establish Care    NOV FLMA paperwork.   Subjective    Kristy Knight is a 28 y.o. female who presents today as a new patient to establish care.  HPI HPI     Establish Care    Additional comments: NOV FLMA paperwork.      Last edited by Liz Beach, CMA on 02/11/2024  1:48 PM.      *** Discussed the use of AI scribe software for clinical note transcription with the patient, who gave verbal consent to proceed.  History of Present Illness           PHQ9 score17 8 and  Past Medical History:  Diagnosis Date   Anxiety    Pott's disease    History reviewed. No pertinent surgical history. Family Status  Relation Name Status   Mother  Alive   Father  Alive  No partnership data on file   History reviewed. No pertinent family history. Social History   Socioeconomic History   Marital status: Single    Spouse name: Not on file   Number of children: Not on file   Years of education: Not on file   Highest education level: Not on file  Occupational History   Not on file  Tobacco Use   Smoking status: Every Day    Current packs/day: 1.00    Types: Cigarettes   Smokeless tobacco: Never  Vaping Use   Vaping status: Never Used  Substance and Sexual Activity   Alcohol use: No   Drug use: No   Sexual activity: Not Currently  Other Topics Concern   Not on file  Social History Narrative   Not on file   Social Drivers of Health   Financial Resource Strain: Not on file  Food Insecurity: Not on file  Transportation Needs: Not on file  Physical Activity: Not on file  Stress: Not on file  Social Connections: Not on file   Outpatient Medications Prior to Visit  Medication Sig   Acetaminophen (TYLENOL PO) Take 500 mg by mouth.   hydrOXYzine  (ATARAX/VISTARIL) 25 MG tablet Take 1 tablet (25 mg total) by mouth 2 (two) times daily as needed for anxiety.   loratadine (CLARITIN) 10 MG tablet Take 10 mg by mouth daily.   Multiple Vitamin (MULTIVITAMIN) tablet Take 1 tablet by mouth daily.   sertraline (ZOLOFT) 100 MG tablet Take 1 tablet (100 mg total) by mouth daily.   No facility-administered medications prior to visit.   No Known Allergies   There is no immunization history on file for this patient.  Health Maintenance  Topic Date Due   Pneumococcal Vaccine 2-63 Years old (1 of 2 - PCV) Never done   HIV Screening  Never done   DTaP/Tdap/Td (1 - Tdap) Never done   COVID-19 Vaccine (1 - 2024-25 season) Never done   Cervical Cancer Screening (Pap smear)  03/26/2026   INFLUENZA VACCINE  Completed   Hepatitis C Screening  Completed   HPV VACCINES  Aged Out    Patient Care Team: Debera Lat, PA-C as PCP - General (Physician Assistant)  Review of Systems Except see HPI   {Insert previous labs (optional):23779} {See past labs  Heme  Chem  Endocrine  Serology  Results Review (optional):1}  Objective    BP 139/87 (BP Location: Right Arm, Patient Position: Sitting, Cuff Size: Normal)   Pulse 79   Temp 98.1 F (36.7 C) (Oral)   Resp 16   Ht 5\' 2"  (1.575 m)   Wt 172 lb (78 kg)   SpO2 99%   BMI 31.46 kg/m  {Insert last BP/Wt (optional):23777}{See vitals history (optional):1}   Physical Exam  Depression Screen    07/24/2022    2:09 PM 01/03/2021   11:07 AM 05/02/2020    9:40 AM 01/18/2020    8:52 AM  PHQ 2/9 Scores  PHQ - 2 Score 5  1 2   PHQ- 9 Score 24  6      Information is confidential and restricted. Go to Review Flowsheets to unlock data.   No results found for any visits on 02/11/24.  Assessment & Plan     *** Assessment and Plan              Encounter to establish care Welcomed to our clinic Reviewed past medical hx, social hx, family hx and surgical hx Pt advised to send all  vaccination records or screening   No follow-ups on file.    The patient was advised to call back or seek an in-person evaluation if the symptoms worsen or if the condition fails to improve as anticipated.  I discussed the assessment and treatment plan with the patient. The patient was provided an opportunity to ask questions and all were answered. The patient agreed with the plan and demonstrated an understanding of the instructions.  I, Debera Lat, PA-C have reviewed all documentation for this visit. The documentation on  02/11/2024   for the exam, diagnosis, procedures, and orders are all accurate and complete.  Debera Lat, [facility], MMS [facility] [phone] (phone) [phone] (fax)  [facility]

## 2024-02-12 ENCOUNTER — Encounter: Payer: Self-pay | Admitting: Physician Assistant

## 2024-02-12 DIAGNOSIS — R42 Dizziness and giddiness: Secondary | ICD-10-CM | POA: Insufficient documentation

## 2024-02-12 DIAGNOSIS — R002 Palpitations: Secondary | ICD-10-CM | POA: Insufficient documentation

## 2024-02-12 DIAGNOSIS — R61 Generalized hyperhidrosis: Secondary | ICD-10-CM | POA: Insufficient documentation

## 2024-02-12 DIAGNOSIS — H53133 Sudden visual loss, bilateral: Secondary | ICD-10-CM | POA: Insufficient documentation

## 2024-02-12 DIAGNOSIS — R0789 Other chest pain: Secondary | ICD-10-CM | POA: Insufficient documentation

## 2024-02-12 DIAGNOSIS — R0602 Shortness of breath: Secondary | ICD-10-CM | POA: Insufficient documentation

## 2024-02-12 DIAGNOSIS — R55 Syncope and collapse: Secondary | ICD-10-CM | POA: Insufficient documentation

## 2024-02-14 ENCOUNTER — Encounter: Payer: Self-pay | Admitting: Physician Assistant

## 2024-02-14 ENCOUNTER — Ambulatory Visit: Admitting: Physician Assistant

## 2024-02-14 VITALS — BP 119/93 | HR 78 | Ht 62.0 in | Wt 173.5 lb

## 2024-02-14 DIAGNOSIS — F419 Anxiety disorder, unspecified: Secondary | ICD-10-CM

## 2024-02-14 DIAGNOSIS — H53133 Sudden visual loss, bilateral: Secondary | ICD-10-CM

## 2024-02-14 DIAGNOSIS — F322 Major depressive disorder, single episode, severe without psychotic features: Secondary | ICD-10-CM | POA: Diagnosis not present

## 2024-02-14 DIAGNOSIS — G629 Polyneuropathy, unspecified: Secondary | ICD-10-CM

## 2024-02-14 DIAGNOSIS — F840 Autistic disorder: Secondary | ICD-10-CM | POA: Diagnosis not present

## 2024-02-14 DIAGNOSIS — R55 Syncope and collapse: Secondary | ICD-10-CM

## 2024-02-14 NOTE — Progress Notes (Signed)
 Established patient visit  Patient: Kristy Knight   DOB: [birth year]   28 y.o. Female  MRN: [id number] Visit Date: 02/14/2024  Today's healthcare provider: Debera Lat, PA-C   Chief Complaint  Patient presents with   Medical Management of Chronic Issues   Anxiety    Prescribe Buspar 5 mg once daily, increase to twice daily if tolerated. Patient reports she is still taking one tablet day. She reports things are about the same but worse at times. Would like to know when she can transition to twice a day   Subjective       Discussed the use of AI scribe software for clinical note transcription with the patient, who gave verbal consent to proceed.  History of Present Illness The patient, with a history of syncope and neuropathy, presents with worsening symptoms. The patient reports experiencing episodes of syncope, which are often accompanied by numbness in the neck and arm. These episodes are unpredictable and have been occurring for an unspecified duration. The patient's mother reports that these episodes often leave the patient unresponsive and require her to lie down. The patient also reports experiencing shaking during these episodes.  In addition to these physical symptoms, the patient has a history of depression and anxiety. The patient's mother suggests that the patient's mental health may be contributing to the severity of her physical symptoms. The patient is currently taking Buspar for her mental health symptoms and reports some improvement with this medication. However, the patient's mother suggests that the patient may need additional mental health support.  The patient's symptoms have had a significant impact on her ability to work. She reports needing to take time off work during her episodes and is currently seeking a leave of absence. The patient's mother reports that the patient's symptoms often worsen around her menstrual cycle, suggesting a possible hormonal component  to her condition.       02/14/2024    1:15 PM 02/11/2024    4:55 PM 07/24/2022    2:09 PM  Depression screen PHQ 2/9  Decreased Interest 0 0 3  Down, Depressed, Hopeless 1 0 2  PHQ - 2 Score 1 0 5  Altered sleeping 3 3 3   Tired, decreased energy 2 1 3   Change in appetite 2 1 3   Feeling bad or failure about yourself  3 2 3   Trouble concentrating 1 0 3  Moving slowly or fidgety/restless 3 1 3   Suicidal thoughts 0 0 1  PHQ-9 Score 15 8 24   Difficult doing work/chores Extremely dIfficult Very difficult Very difficult      02/14/2024    1:15 PM 02/11/2024    4:56 PM 01/03/2021   11:07 AM 05/02/2020    9:43 AM  GAD 7 : Generalized Anxiety Score  Nervous, Anxious, on Edge 3 3  1   Control/stop worrying 3 3  1   Worry too much - different things 3 3  1   Trouble relaxing 3 3  1   Restless 2 3  1   Easily annoyed or irritable 1 2  0  Afraid - awful might happen 3 3  0  Total GAD 7 Score 18 20  5   Anxiety Difficulty Extremely difficult   Somewhat difficult     Information is confidential and restricted. Go to Review Flowsheets to unlock data.    Medications: Outpatient Medications Prior to Visit  Medication Sig   Acetaminophen (TYLENOL PO) Take 500 mg by mouth.   busPIRone (BUSPAR) 5 MG tablet Take 1 tablet (5  mg total) by mouth 2 (two) times daily.   hydrOXYzine (ATARAX/VISTARIL) 25 MG tablet Take 1 tablet (25 mg total) by mouth 2 (two) times daily as needed for anxiety.   loratadine (CLARITIN) 10 MG tablet Take 10 mg by mouth daily.   Multiple Vitamin (MULTIVITAMIN) tablet Take 1 tablet by mouth daily.   sertraline (ZOLOFT) 100 MG tablet Take 1 tablet (100 mg total) by mouth daily.   No facility-administered medications prior to visit.    Review of Systems All negative Except see HPI       Objective    BP (!) 119/93 (BP Location: Left Arm, Patient Position: Sitting, Cuff Size: Normal)   Pulse 78   Ht 5\' 2"  (1.575 m)   Wt 173 lb 8 oz (78.7 kg)   SpO2 100%   Breastfeeding  No   BMI 31.73 kg/m     Physical Exam Vitals reviewed.  Constitutional:      General: She is not in acute distress.    Appearance: Normal appearance. She is well-developed. She is not diaphoretic.  HENT:     Head: Normocephalic and atraumatic.  Eyes:     General: No scleral icterus.    Conjunctiva/sclera: Conjunctivae normal.  Neck:     Thyroid: No thyromegaly.  Cardiovascular:     Rate and Rhythm: Normal rate and regular rhythm.     Pulses: Normal pulses.     Heart sounds: Normal heart sounds. No murmur heard. Pulmonary:     Effort: Pulmonary effort is normal. No respiratory distress.     Breath sounds: Normal breath sounds. No wheezing, rhonchi or rales.  Musculoskeletal:     Cervical back: Neck supple.     Right lower leg: No edema.     Left lower leg: No edema.  Lymphadenopathy:     Cervical: No cervical adenopathy.  Skin:    General: Skin is warm and dry.     Findings: No rash.  Neurological:     General: No focal deficit present.     Mental Status: She is alert and oriented to person, place, and time. Mental status is at baseline.     Cranial Nerves: No cranial nerve deficit.     Sensory: No sensory deficit.     Motor: No weakness.     Coordination: Coordination normal.     Gait: Gait normal.     Deep Tendon Reflexes: Reflexes normal.  Psychiatric:        Mood and Affect: Mood normal.        Behavior: Behavior normal.      No results found for any visits on 02/14/24.      Assessment and Plan Assessment & Plan Syncope Chronic condition, was evaluated by cardiology Syncope influenced by dietary habits and prolonged standing. Cardiology identified occasional arrhythmia, not severe enough for intervention. Episodes likely reflexive, requiring trigger management. - Avoid known triggers. - Follow up with cardiology in two weeks.  Neuropathy Periodic numbness and tingling in neck and arm, possibly related to past trauma. No evidence of seizures. Symptoms  warrant neurology evaluation. - Refer to neurology for evaluation.  Depression and Anxiety Chronic and worsening    02/14/2024    1:15 PM 02/11/2024    4:55 PM 07/24/2022    2:09 PM  PHQ9 SCORE ONLY  PHQ-9 Total Score 15 8 24       02/14/2024    1:15 PM 02/11/2024    4:56 PM 01/03/2021   11:07 AM 05/02/2020    9:43  AM  GAD 7 : Generalized Anxiety Score  Nervous, Anxious, on Edge 3 3  1   Control/stop worrying 3 3  1   Worry too much - different things 3 3  1   Trouble relaxing 3 3  1   Restless 2 3  1   Easily annoyed or irritable 1 2  0  Afraid - awful might happen 3 3  0  Total GAD 7 Score 18 20  5   Anxiety Difficulty Extremely difficult   Somewhat difficult     Information is confidential and restricted. Go to Review Flowsheets to unlock data.   Signs of depression and anxiety, possibly exacerbated by post-traumatic stress. Patient has been in foster care for many years. Her current mother is her adopted mother. Episodes of shaking and memory issues related to emotional distress. Behavioral health evaluation recommended for medication adjustment and therapy. - Start Buspar, up to two tablets daily. Continue taking zoloft  - Refer to behavioral health for evaluation. - Visit [facility] in [facility] for urgent mental health assessment. - Consider psychiatric evaluation for comprehensive assessment. Will follow-up  Vision Changes Blurry vision, particularly in left eye, began during pregnancy with preeclampsia. Issues persist postpartum, requiring ophthalmological evaluation. - Follow up with scheduled eye appointment in April.  Autism spectrum disorder Pt was diagnosed in 2021 Did [facility] and [facility] [place]. Will need to be reassessed by neurology, psychology, [facility]  Consider OT and social worker Follow-up  Ongoing monitoring and follow-up appointments required for effective management. Leave of absence paperwork necessary due to episodes at work. -  Schedule follow-up appointment in six weeks. - Ensure completion of leave of absence paperwork. - Coordinate with leave of absence office for potential extension if needed.   No orders of the defined types were placed in this encounter.   Return in about 6 weeks (around 03/27/2024) for chronic disease f/u.   The patient was advised to call back or seek an in-person evaluation if the symptoms worsen or if the condition fails to improve as anticipated.  I discussed the assessment and treatment plan with the patient. The patient was provided an opportunity to ask questions and all were answered. The patient agreed with the plan and demonstrated an understanding of the instructions.  I, Debera Lat, PA-C have reviewed all documentation for this visit. The documentation on 02/14/2024  for the exam, diagnosis, procedures, and orders are all accurate and complete.  Debera Lat, [facility], MMS [facility] [phone] (phone) [phone] (fax)  [facility]

## 2024-02-18 ENCOUNTER — Telehealth: Payer: Self-pay

## 2024-02-18 NOTE — Progress Notes (Signed)
 Complex Care Management Note Care Guide Note  02/18/2024 Name: Kristy Knight MRN: [id number] DOB: 27-Oct-1996   Complex Care Management Outreach Attempts: An unsuccessful telephone outreach was attempted today to offer the patient information about available complex care management services.  Follow Up Plan:  Additional outreach attempts will be made to offer the patient complex care management information and services.   Encounter Outcome:  No Answer  Penne Lash , RMA     [place]  [facility], [facility] Guide  Direct Dial: [phone]  Website: [email or web address]

## 2024-02-24 ENCOUNTER — Telehealth: Payer: Self-pay | Admitting: Physician Assistant

## 2024-02-24 NOTE — Progress Notes (Signed)
 Complex Care Management Note  Care Guide Note 02/24/2024 Name: ARNESIA VINCELETTE MRN: [id number] DOB: 11/27/1995  Madaline Savage Durrett is a 28 y.o. year old female who sees [place], [place], [place] for primary care. I reached out to [name] by phone today to offer complex care management services.  Ms. Orsak was given information about Complex Care Management services today including:   The Complex Care Management services include support from the care team which includes your Nurse Care Manager, Clinical Social Worker, or Pharmacist.  The Complex Care Management team is here to help remove barriers to the health concerns and goals most important to you. Complex Care Management services are voluntary, and the patient may decline or stop services at any time by request to their care team member.   Complex Care Management Consent Status: Patient agreed to services and verbal consent obtained.   Follow up plan:  Telephone appointment with complex care management team member scheduled for:  02/26/2024  Encounter Outcome:  No Answer  Penne Lash , RMA     [place]  [facility], [facility] Guide  Direct Dial: [phone]  Website: [email or web address]

## 2024-02-24 NOTE — Telephone Encounter (Signed)
 Informed patient that FMLA paperwork was com[pleted and faxed. A copy will be left up front.

## 2024-02-26 ENCOUNTER — Ambulatory Visit: Payer: Self-pay | Admitting: Licensed Clinical Social Worker

## 2024-02-26 NOTE — Patient Instructions (Signed)
 Visit Information  Thank you for taking time to visit with me today. Please don't hesitate to contact me if I can be of assistance to you.   Following are the goals we discussed today:   Goals Addressed             This Visit's Progress    Care Coordination       Patient encouraged to continue self care options to alleviate anxiety Patient advised to contact [facility] regarding coverage for baby formula  Patient encouraged to apply for [employer] unemp benefits to supplement loss income due to medical leave        Our next appointment is by telephone on 03/18/24 at 11am  Please call the care guide team at [phone] if you need to cancel or reschedule your appointment.   If you are experiencing a Mental Health or Behavioral Health Crisis or need someone to talk to, please call 911   Patient verbalizes understanding of instructions and care plan provided today and agrees to view in MyChart. Active MyChart status and patient understanding of how to access instructions and care plan via MyChart confirmed with patient.     Telephone follow up appointment with care management team member scheduled for:03/18/24  Gwyndolyn Saxon MSW, LCSW Licensed Clinical Social Worker  [facility], Population Health Direct Dial: [phone]  Fax: [phone]

## 2024-02-26 NOTE — Patient Outreach (Signed)
 Care Coordination   Initial Visit Note   02/26/2024 Name: KITT MINARDI MRN: [id number] DOB: 1996-10-06  Madaline Savage Kehres is a 28 y.o. year old female who sees [place], [place], [place] for primary care. I spoke with  [name] by phone today.  What matters to the patients health and wellness today?  Completed initial phone visit with Ms. Alla Feeling. Pt needed information regarding financial assistance due to loss income from medical leave.     Goals Addressed             This Visit's Progress    Care Coordination       Patient encouraged to continue self care options to alleviate anxiety Patient advised to contact [facility] regarding coverage for baby formula  Patient encouraged to apply for [employer] unemp benefits to supplement loss income due to medical leave        SDOH assessments and interventions completed:  Yes  SDOH Interventions Today    Flowsheet Row Most Recent Value  SDOH Interventions   Food Insecurity Interventions Intervention Not Indicated  Housing Interventions Intervention Not Indicated  Transportation Interventions Intervention Not Indicated  Utilities Interventions Intervention Not Indicated        Care Coordination Interventions:  Yes, provided  Interventions Today    Flowsheet Row Most Recent Value  General Interventions   General Interventions Discussed/Reviewed Community Resources  [Pt encouraged to file [employer] unemp insurance to help supplement income loss due to medical leave.]  Education Interventions   Education Provided Provided Education  Provided Verbal Education On [employer]  [Pt encouraged to contact [facility] office regarding formula coverage options]  Mental Health Interventions   Mental Health Discussed/Reviewed --  [Pt advised to located therapist that accepts her insurance on [employer] and psychology today. Pt reports her mother is supportive.]       Follow up plan: Follow up call scheduled  for 03/18/2024    Encounter Outcome:  Patient Visit Completed   Gwyndolyn Saxon MSW, LCSW Licensed Clinical Social Worker  [facility], Population Health Direct Dial: [phone]  Fax: [phone]

## 2024-03-18 ENCOUNTER — Ambulatory Visit: Payer: Self-pay | Admitting: Licensed Clinical Social Worker

## 2024-03-18 NOTE — Patient Instructions (Signed)
 Visit Information  Thank you for taking time to visit with me today. Please don't hesitate to contact me if I can be of assistance to you before our next scheduled appointment.   Patient has met all care management goals. Care Management case will be closed. Patient has been provided contact information should new needs arise.   Please call the care guide team at [phone] if you need to cancel, schedule, or reschedule an appointment.   Please call 911 if you are experiencing a Mental Health or Behavioral Health Crisis or need someone to talk to.  Gwyndolyn Saxon MSW, LCSW Licensed Clinical Social Worker  [facility], Population Health Direct Dial: [phone]  Fax: [phone]

## 2024-03-18 NOTE — Patient Outreach (Signed)
 Complex Care Management   Visit Note  03/18/2024  Name:  Kristy Knight MRN: [id number] DOB: 1996/02/23  Situation: Referral received for Complex Care Management related to [place]/Behavioral Health diagnosis anxiety  I obtained verbal consent from Patient.  Visit completed with Kristy Knight  on the phone  Background:   Past Medical History:  Diagnosis Date   Anxiety    Pott's disease     Assessment: Patient Reported Symptoms:  Cognitive        Neurological      HEENT        Cardiovascular      Respiratory      Endocrine      Gastrointestinal        Genitourinary      Integumentary      Musculoskeletal          Psychosocial       Do you feel physically threatened by others?: No      02/14/2024    1:15 PM  Depression screen PHQ 2/9  Decreased Interest 0  Down, Depressed, Hopeless 1  PHQ - 2 Score 1  Altered sleeping 3  Tired, decreased energy 2  Change in appetite 2  Feeling bad or failure about yourself  3  Trouble concentrating 1  Moving slowly or fidgety/restless 3  Suicidal thoughts 0  PHQ-9 Score 15  Difficult doing work/chores Extremely dIfficult    There were no vitals filed for this visit.  Medications Reviewed Today     Reviewed by Fletcher Humble, LCSW (Social Worker) on 03/18/24 at 1444  Med List Status: <None>   Medication Order Taking? Sig Documenting Provider Last Dose Status Informant  Acetaminophen  (TYLENOL  PO) [id number] No Take 500 mg by mouth. [provider] Taking Active   busPIRone  (BUSPAR ) 5 MG tablet [id number] No Take 1 tablet (5 mg total) by mouth 2 (two) times daily. Ostwalt, Janna, PA-C Taking Active   hydrOXYzine  (ATARAX /VISTARIL ) 25 MG tablet [id number] No Take 1 tablet (25 mg total) by mouth 2 (two) times daily as needed for anxiety. Herby Lolling, MD Taking Active Pharmacy Records, Multiple Informants  loratadine (CLARITIN) 10 MG tablet [id number] No Take 10 mg by mouth daily. [provider]  Taking Active   Multiple Vitamin (MULTIVITAMIN) tablet [id number] No Take 1 tablet by mouth daily. [provider] Taking Active   sertraline  (ZOLOFT ) 100 MG tablet [id number] No Take 1 tablet (100 mg total) by mouth daily. Herby Lolling, MD Taking Active Pharmacy Records, Multiple Informants            Recommendation:   Contact legal aid of [place] regarding custody concerns. Pt given list of mental health providers to start in person therapy. Pt directed to contact [place] [facility] office to switch infant formula vouchers.   Follow Up Plan:   Patient has met all care management goals. Care Management case will be closed. Patient has been provided contact information should new needs arise.   Fletcher Humble MSW, LCSW Licensed Clinical Social Worker  [facility], Population Health Direct Dial: [phone]  Fax: [phone]

## 2024-03-27 ENCOUNTER — Ambulatory Visit (INDEPENDENT_AMBULATORY_CARE_PROVIDER_SITE_OTHER): Admitting: Physician Assistant

## 2024-03-27 ENCOUNTER — Encounter: Payer: Self-pay | Admitting: Physician Assistant

## 2024-03-27 VITALS — BP 105/77 | HR 87 | Resp 16 | Ht 63.0 in

## 2024-03-27 DIAGNOSIS — Z309 Encounter for contraceptive management, unspecified: Secondary | ICD-10-CM

## 2024-03-27 DIAGNOSIS — F840 Autistic disorder: Secondary | ICD-10-CM | POA: Diagnosis not present

## 2024-03-27 DIAGNOSIS — F3341 Major depressive disorder, recurrent, in partial remission: Secondary | ICD-10-CM

## 2024-03-27 DIAGNOSIS — H53133 Sudden visual loss, bilateral: Secondary | ICD-10-CM

## 2024-03-27 DIAGNOSIS — F419 Anxiety disorder, unspecified: Secondary | ICD-10-CM | POA: Diagnosis not present

## 2024-03-27 DIAGNOSIS — G629 Polyneuropathy, unspecified: Secondary | ICD-10-CM

## 2024-03-27 DIAGNOSIS — R55 Syncope and collapse: Secondary | ICD-10-CM | POA: Diagnosis not present

## 2024-03-27 DIAGNOSIS — F1990 Other psychoactive substance use, unspecified, uncomplicated: Secondary | ICD-10-CM | POA: Diagnosis not present

## 2024-03-27 MED ORDER — SERTRALINE HCL 100 MG PO TABS: 100.0000 mg | ORAL_TABLET | Freq: Every day | ORAL | 1 refills | Status: DC

## 2024-03-27 MED ORDER — BUSPIRONE HCL 5 MG PO TABS: 5.0000 mg | ORAL_TABLET | Freq: Two times a day (BID) | ORAL | 1 refills | Status: AC

## 2024-03-27 MED ORDER — HYDROXYZINE HCL 25 MG PO TABS: 25.0000 mg | ORAL_TABLET | Freq: Two times a day (BID) | ORAL | 2 refills | Status: DC | PRN

## 2024-03-27 NOTE — Progress Notes (Signed)
 Established patient visit  Patient: Kristy Knight   DOB: 1996-01-06   28 y.o. Female  MRN: [id number] Visit Date: 03/27/2024  Today's healthcare provider: Blane Bunting, PA-C   Chief Complaint  Patient presents with   Follow-up    F/u on Chronic Disease.. referral toThradpy, work     Subjective      Discussed the use of AI scribe software for clinical note transcription with the patient, who gave verbal consent to proceed.  History of Present Illness Kristy Knight is a 28 year old female who presents with medication management issues and insurance complications. She is accompanied by her mother.  She is experiencing insurance issues affecting her ability to obtain medications, specifically Buspar , which she has not taken for two days. She plans to resolve this by picking up her medication from [facility] after switching from [facility] due to cost issues. She takes Zoloft  150 mg daily, which was not adjusted post-pregnancy, and has been taking 50 mg in the morning to compensate for the lapse in Buspar . Hydroxyzine  is used as needed for anxiety.  She experiences menstrual irregularities, with her period being a week late last month but now back on track. She started her period today, affecting her energy levels and mood, and reports bloating and swelling. She is considering a hysterectomy and seeks a referral to a new OB-GYN.  She has a history of syncope with a cardiology appointment scheduled for May 7th. She reports vision problems and is awaiting a specialist appointment due to insurance issues. Her new glasses are not the correct prescription.  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 She has recently started vaping again and has smoked a few cigarettes due to stress. She is currently formula feeding her five-month-old child after breastfeeding for the first month and a half.       02/14/2024    1:15 PM 02/11/2024    4:55 PM 07/24/2022    2:09 PM   Depression screen PHQ 2/9  Decreased Interest 0 0 3  Down, Depressed, Hopeless 1 0 2  PHQ - 2 Score 1 0 5  Altered sleeping 3 3 3   Tired, decreased energy 2 1 3   Change in appetite 2 1 3   Feeling bad or failure about yourself  3 2 3   Trouble concentrating 1 0 3  Moving slowly or fidgety/restless 3 1 3   Suicidal thoughts 0 0 1  PHQ-9 Score 15 8 24   Difficult doing work/chores Extremely dIfficult Very difficult Very difficult      02/14/2024    1:15 PM 02/11/2024    4:56 PM 01/03/2021   11:07 AM 05/02/2020    9:43 AM  GAD 7 : Generalized Anxiety Score  Nervous, Anxious, on Edge 3 3  1   Control/stop worrying 3 3  1   Worry too much - different things 3 3  1   Trouble relaxing 3 3  1   Restless 2 3  1   Easily annoyed or irritable 1 2  0  Afraid - awful might happen 3 3  0  Total GAD 7 Score 18 20  5   Anxiety Difficulty Extremely difficult   Somewhat difficult     Information is confidential and restricted. Go to Review Flowsheets to unlock data.    Medications: Outpatient Medications Prior to Visit  Medication Sig   Acetaminophen  (TYLENOL  PO) Take 500 mg by mouth.   hydrOXYzine  (ATARAX /VISTARIL ) 25 MG tablet Take 1 tablet (25 mg total) by mouth 2 (two)  times daily as needed for anxiety.   loratadine (CLARITIN) 10 MG tablet Take 10 mg by mouth daily.   Multiple Vitamin (MULTIVITAMIN) tablet Take 1 tablet by mouth daily.   sertraline  (ZOLOFT ) 100 MG tablet Take 1 tablet (100 mg total) by mouth daily.   busPIRone  (BUSPAR ) 5 MG tablet Take 1 tablet (5 mg total) by mouth 2 (two) times daily. (Patient not taking: Reported on 03/27/2024)   No facility-administered medications prior to visit.    Review of Systems All negative Except see HPI       Objective    There were no vitals taken for this visit.    Physical Exam Vitals reviewed.  Constitutional:      General: She is not in acute distress.    Appearance: Normal appearance. She is well-developed. She is not diaphoretic.   HENT:     Head: Normocephalic and atraumatic.  Eyes:     General: No scleral icterus.    Conjunctiva/sclera: Conjunctivae normal.  Neck:     Thyroid: No thyromegaly.  Cardiovascular:     Rate and Rhythm: Normal rate and regular rhythm.     Pulses: Normal pulses.     Heart sounds: Normal heart sounds. No murmur heard. Pulmonary:     Effort: Pulmonary effort is normal. No respiratory distress.     Breath sounds: Normal breath sounds. No wheezing, rhonchi or rales.  Musculoskeletal:     Cervical back: Neck supple.     Right lower leg: No edema.     Left lower leg: No edema.  Lymphadenopathy:     Cervical: No cervical adenopathy.  Skin:    General: Skin is warm and dry.     Findings: No rash.  Neurological:     Mental Status: She is alert and oriented to person, place, and time. Mental status is at baseline.  Psychiatric:        Mood and Affect: Mood normal.        Behavior: Behavior normal.      No results found for any visits on 03/27/24.      Assessment & Plan Depression and anxiety Chronic, unstable Depression and anxiety persist. Currently on Zoloft  150 mg daily. Buspar  discontinued for two days due to insurance issues, plans to resume today. Manages anxiety with hydroxyzine  as needed. Discussed Zoloft  and Buspar  use, including temporary Zoloft  increase. No current therapy due to insurance issues, plans to resolve soon. Referral to social worker for medication access discussed. - [facility], 180 tablets, twice daily. - Ensure Zoloft  prescription is up to date. - Prescribe hydroxyzine  as needed for anxiety. - Refer to Child psychotherapist for medication access if needed. Will follow-up Due to problems with med refills. Referred to [facility]  Syncope Syncope under evaluation. Occasional arrhythmia was identified previously. Was advised to manage triggers/dietary habits and prolonged standing. Cardiology appointment scheduled for May 7th, per pt  request. - Attend cardiology appointment on May 7th.  Neuropathy Numbness and tingling in neck and arm, related to past trauma Suspected neurological character of pt's syncope. No evidence of seizures. Scheduled with neurology  Vision problems Improved. New glasses obtained, specialist appointment pending due to insurance issues. Plans to resolve insurance issues and schedule ophthalmology and neurology appointments. [employer] insurance issues and schedule appointments with ophthalmology and neurology.  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 Recently started vaping and smoked a few cigarettes due to stress,  no relapse into alcohol or drug use.  Contraception and family planning Desires hysterectomy, signed necessary paperwork. Prefers new OBGYN due to dissatisfaction with previous care. Needs referral to OBGYN covered by Medicaid. - Provide referral to a new OBGYN for hysterectomy consultation.  Autism spectrum disorder Was dx in 2021 Cannot attend counseling due to problems with insurnace Will need to be reassess by psychology and [facility]!  Anxiety  - AMB Referral [facility] - busPIRone  (BUSPAR ) 5 MG tablet; Take 1 tablet (5 mg total) by mouth 2 (two) times daily.  Dispense: 180 tablet; Refill: 1 - hydrOXYzine  (ATARAX ) 25 MG tablet; Take 1 tablet (25 mg total) by mouth 2 (two) times daily as needed for anxiety.  Dispense: 30 tablet; Refill: 2 - sertraline  (ZOLOFT ) 100 MG tablet; Take 1 tablet (100 mg total) by mouth daily.  Dispense: 90 tablet; Refill: 1  MDD (major depressive disorder), recurrent, in partial remission (HCC) - AMB Referral [facility] Care Management - busPIRone  (BUSPAR ) 5 MG tablet; Take 1 tablet (5 mg total) by mouth 2 (two) times daily.  Dispense: 180 tablet; Refill: 1 - hydrOXYzine  (ATARAX ) 25 MG tablet; Take 1 tablet (25 mg total) by mouth 2 (two) times daily as needed for anxiety.  Dispense: 30  tablet; Refill: 2 - sertraline  (ZOLOFT ) 100 MG tablet; Take 1 tablet (100 mg total) by mouth daily.  Dispense: 90 tablet; Refill: 1  Encounter for contraceptive management, unspecified type (Primary) - Ambulatory referral to Obstetrics / Gynecology   No orders of the defined types were placed in this encounter.   No follow-ups on file.   The patient was advised to call back or seek an in-person evaluation if the symptoms worsen or if the condition fails to improve as anticipated.  I discussed the assessment and treatment plan with the patient. The patient was provided an opportunity to ask questions and all were answered. The patient agreed with the plan and demonstrated an understanding of the instructions.  I, Soumya Colson, PA-C have reviewed all documentation for this visit. The documentation on 03/27/2024  for the exam, diagnosis, procedures, and orders are all accurate and complete.  Blane Bunting, [facility], MMS [facility] [phone] (phone) [phone] (fax)  [facility]

## 2024-03-30 ENCOUNTER — Emergency Department
Admission: EM | Admit: 2024-03-30 | Discharge: 2024-03-30 | Disposition: A | Discharge status: Home / Self Care | Attending: Emergency Medicine | Admitting: Emergency Medicine

## 2024-03-30 ENCOUNTER — Emergency Department

## 2024-03-30 ENCOUNTER — Other Ambulatory Visit: Payer: Self-pay

## 2024-03-30 DIAGNOSIS — S161XXA Strain of muscle, fascia and tendon at neck level, initial encounter: Secondary | ICD-10-CM | POA: Diagnosis not present

## 2024-03-30 DIAGNOSIS — S0990XA Unspecified injury of head, initial encounter: Secondary | ICD-10-CM | POA: Diagnosis present

## 2024-03-30 DIAGNOSIS — S93402A Sprain of unspecified ligament of left ankle, initial encounter: Secondary | ICD-10-CM

## 2024-03-30 DIAGNOSIS — M25572 Pain in left ankle and joints of left foot: Secondary | ICD-10-CM | POA: Diagnosis not present

## 2024-03-30 DIAGNOSIS — S060X0A Concussion without loss of consciousness, initial encounter: Secondary | ICD-10-CM | POA: Diagnosis not present

## 2024-03-30 DIAGNOSIS — Y92410 Unspecified street and highway as the place of occurrence of the external cause: Secondary | ICD-10-CM | POA: Insufficient documentation

## 2024-03-30 DIAGNOSIS — Z981 Arthrodesis status: Secondary | ICD-10-CM | POA: Diagnosis not present

## 2024-03-30 DIAGNOSIS — M542 Cervicalgia: Secondary | ICD-10-CM | POA: Diagnosis not present

## 2024-03-30 DIAGNOSIS — Z041 Encounter for examination and observation following transport accident: Secondary | ICD-10-CM | POA: Diagnosis not present

## 2024-03-30 DIAGNOSIS — S060XAA Concussion with loss of consciousness status unknown, initial encounter: Secondary | ICD-10-CM

## 2024-03-30 DIAGNOSIS — R519 Headache, unspecified: Secondary | ICD-10-CM | POA: Diagnosis not present

## 2024-03-30 DIAGNOSIS — S99912A Unspecified injury of left ankle, initial encounter: Secondary | ICD-10-CM | POA: Diagnosis not present

## 2024-03-30 MED ORDER — METHOCARBAMOL 500 MG PO TABS
500.0000 mg | ORAL_TABLET | Freq: Once | ORAL | Status: AC
  Administered 2024-03-30: 500 mg via ORAL
  Filled 2024-03-30: qty 1

## 2024-03-30 MED ORDER — METHOCARBAMOL 500 MG PO TABS
500.0000 mg | ORAL_TABLET | Freq: Three times a day (TID) | ORAL | 0 refills | Status: AC | PRN
Start: 2024-03-30 — End: ?

## 2024-03-30 MED ORDER — ACETAMINOPHEN 500 MG PO TABS
1000.0000 mg | ORAL_TABLET | Freq: Once | ORAL | Status: AC
  Administered 2024-03-30: 1000 mg via ORAL
  Filled 2024-03-30: qty 2
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
## 2024-03-30 NOTE — Discharge Instructions (Addendum)
 Please continue with Tylenol  every 6-8 hours as needed for pain.  Use muscle relaxers 3 times daily as needed for muscle spasms/muscle tightness.  Use ankle brace over the next few days as needed for comfort.  Rest ice and elevate the ankle.  You may apply heating pad to the cervical spine and work on gentle neck range of motion exercises.  Follow-up with orthopedic specialist in 1 week if no improvement.  Return to the ER for any worsening symptoms or any urgent changes in your health
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
## 2024-03-31 ENCOUNTER — Telehealth: Payer: Self-pay

## 2024-03-31 NOTE — Progress Notes (Signed)
 Care Guide Pharmacy Note  03/31/2024 Name: Kristy Knight MRN: [id number] DOB: 1996/08/23  Referred By: Ostwalt, Janna, PA-C Reason for referral: Complex Care Management (Outreach to schedule with Pharm d )   Kristy Knight is a 28 y.o. year old female who is a primary care patient of Ostwalt, Janna, PA-C.  Kristy Knight was referred to the pharmacist for assistance related to: Anxiety and Depression  An unsuccessful telephone outreach was attempted today to contact the patient who was referred to the pharmacy team for assistance with medication management. Additional attempts will be made to contact the patient.  Lenton Rail , RMA     [facility]  [facility] Guide  Direct Dial: [phone]  Website: [email or web address]

## 2024-03-31 NOTE — Progress Notes (Signed)
 Care Guide Pharmacy Note  03/31/2024 Name: Kristy Knight MRN: [id number] DOB: 01-26-1996  Referred By: Ostwalt, Janna, PA-C Reason for referral: Complex Care Management (Outreach to schedule with Pharm d )   Kristy Knight is a 28 y.o. year old female who is a primary care patient of Ostwalt, Janna, PA-C.  Kristy Knight was referred to the pharmacist for assistance related to: Anxiety and Depression  Successful contact was made with the patient to discuss pharmacy services including being ready for the pharmacist to call at least 5 minutes before the scheduled appointment time and to have medication bottles and any blood pressure readings ready for review. The patient agreed to meet with the pharmacist via telephone visit on (date/time).04/07/2024  Kristy Knight , RMA     [place]  [facility], [facility] Guide  Direct Dial: [phone]  Website: [email or web address]

## 2024-04-01 DIAGNOSIS — F99 Mental disorder, not otherwise specified: Secondary | ICD-10-CM | POA: Diagnosis not present

## 2024-04-03 ENCOUNTER — Other Ambulatory Visit: Payer: Self-pay | Admitting: Physician Assistant

## 2024-04-03 ENCOUNTER — Encounter (HOSPITAL_COMMUNITY): Payer: Self-pay

## 2024-04-03 DIAGNOSIS — F419 Anxiety disorder, unspecified: Secondary | ICD-10-CM

## 2024-04-03 DIAGNOSIS — F332 Major depressive disorder, recurrent severe without psychotic features: Secondary | ICD-10-CM | POA: Diagnosis not present

## 2024-04-03 DIAGNOSIS — F3341 Major depressive disorder, recurrent, in partial remission: Secondary | ICD-10-CM

## 2024-04-07 ENCOUNTER — Telehealth: Payer: Self-pay | Admitting: Pharmacist

## 2024-04-07 ENCOUNTER — Other Ambulatory Visit: Payer: Self-pay | Admitting: Pharmacist

## 2024-04-07 NOTE — Progress Notes (Signed)
   04/07/2024  Patient ID: Kristy Knight, female   DOB: [birth year], 28 y.o.   MRN: [id number]  Attempted to contact patient for scheduled appointment for medication management. Left HIPAA compliant message for patient to return my call at their convenience.    Sending back to [facility] to schedule.    Delvin File, PharmD [facility]  Phone Number: [phone]
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
## 2024-04-10 ENCOUNTER — Ambulatory Visit (INDEPENDENT_AMBULATORY_CARE_PROVIDER_SITE_OTHER): Admitting: Physician Assistant

## 2024-04-10 ENCOUNTER — Encounter: Payer: Self-pay | Admitting: Physician Assistant

## 2024-04-10 VITALS — BP 118/69 | HR 89 | Resp 16 | Ht 62.0 in | Wt 172.0 lb

## 2024-04-10 DIAGNOSIS — T889XXD Complication of surgical and medical care, unspecified, subsequent encounter: Secondary | ICD-10-CM

## 2024-04-10 DIAGNOSIS — F17290 Nicotine dependence, other tobacco product, uncomplicated: Secondary | ICD-10-CM

## 2024-04-10 DIAGNOSIS — F3341 Major depressive disorder, recurrent, in partial remission: Secondary | ICD-10-CM

## 2024-04-10 DIAGNOSIS — F419 Anxiety disorder, unspecified: Secondary | ICD-10-CM | POA: Diagnosis not present

## 2024-04-10 DIAGNOSIS — F840 Autistic disorder: Secondary | ICD-10-CM
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
## 2024-04-13 ENCOUNTER — Encounter: Payer: Self-pay | Admitting: Obstetrics and Gynecology

## 2024-04-28 ENCOUNTER — Ambulatory Visit: Admitting: Physician Assistant

## 2024-05-05 DIAGNOSIS — F332 Major depressive disorder, recurrent severe without psychotic features: Secondary | ICD-10-CM | POA: Diagnosis not present

## 2024-05-06 DIAGNOSIS — F32A Depression, unspecified: Secondary | ICD-10-CM | POA: Diagnosis not present

## 2024-05-06 DIAGNOSIS — I498 Other specified cardiac arrhythmias: Secondary | ICD-10-CM | POA: Diagnosis not present

## 2024-05-06 DIAGNOSIS — R0602 Shortness of breath: Secondary | ICD-10-CM | POA: Diagnosis not present

## 2024-05-06 DIAGNOSIS — R079 Chest pain, unspecified: Secondary | ICD-10-CM | POA: Diagnosis not present

## 2024-05-06 DIAGNOSIS — R002 Palpitations: Secondary | ICD-10-CM | POA: Diagnosis not present

## 2024-05-06 DIAGNOSIS — R55 Syncope and collapse: Secondary | ICD-10-CM | POA: Diagnosis not present

## 2024-05-06 DIAGNOSIS — F419 Anxiety disorder, unspecified: Secondary | ICD-10-CM | POA: Diagnosis not present

## 2024-05-06 DIAGNOSIS — Z79899 Other long term (current) drug therapy: Secondary | ICD-10-CM | POA: Diagnosis not present

## 2024-05-06 DIAGNOSIS — Z87891 Personal history of nicotine dependence: Secondary | ICD-10-CM | POA: Diagnosis not present

## 2024-05-13 DIAGNOSIS — R002 Palpitations: Secondary | ICD-10-CM | POA: Diagnosis not present

## 2024-06-03 ENCOUNTER — Encounter: Payer: MEDICAID | Admitting: Advanced Practice Midwife

## 2024-06-04 ENCOUNTER — Encounter: Payer: Self-pay | Admitting: Advanced Practice Midwife

## 2024-06-24 ENCOUNTER — Other Ambulatory Visit: Payer: Self-pay | Admitting: Physician Assistant

## 2024-06-24 DIAGNOSIS — F3341 Major depressive disorder, recurrent, in partial remission: Secondary | ICD-10-CM

## 2024-06-24 DIAGNOSIS — F419 Anxiety disorder, unspecified: Secondary | ICD-10-CM

## 2024-08-11 ENCOUNTER — Other Ambulatory Visit (HOSPITAL_COMMUNITY)
Admission: RE | Admit: 2024-08-11 | Discharge: 2024-08-11 | Disposition: A | Discharge status: Home / Self Care | Payer: MEDICAID | Source: Ambulatory Visit | Attending: Physician Assistant | Admitting: Physician Assistant

## 2024-08-11 ENCOUNTER — Encounter: Payer: Self-pay | Admitting: Physician Assistant

## 2024-08-11 ENCOUNTER — Ambulatory Visit: Payer: MEDICAID | Admitting: Physician Assistant

## 2024-08-11 VITALS — BP 102/72 | HR 78 | Resp 14 | Ht 62.0 in | Wt 168.7 lb

## 2024-08-11 DIAGNOSIS — Z0000 Encounter for general adult medical examination without abnormal findings: Secondary | ICD-10-CM

## 2024-08-11 DIAGNOSIS — T889XXD Complication of surgical and medical care, unspecified, subsequent encounter: Secondary | ICD-10-CM

## 2024-08-11 DIAGNOSIS — Z0001 Encounter for general adult medical examination with abnormal findings: Secondary | ICD-10-CM

## 2024-08-11 DIAGNOSIS — N898 Other specified noninflammatory disorders of vagina: Secondary | ICD-10-CM

## 2024-08-11 DIAGNOSIS — Z114 Encounter for screening for human immunodeficiency virus [HIV]: Secondary | ICD-10-CM

## 2024-08-11 DIAGNOSIS — F17290 Nicotine dependence, other tobacco product, uncomplicated: Secondary | ICD-10-CM

## 2024-08-11 DIAGNOSIS — F3341 Major depressive disorder, recurrent, in partial remission: Secondary | ICD-10-CM | POA: Diagnosis not present

## 2024-08-11 DIAGNOSIS — F840 Autistic disorder: Secondary | ICD-10-CM

## 2024-08-11 DIAGNOSIS — F419 Anxiety disorder, unspecified: Secondary | ICD-10-CM

## 2024-08-11 NOTE — Progress Notes (Signed)
 Complete physical exam  Patient: Kristy Knight   DOB: [birth year]   28 y.o. Female  MRN: [id number] Visit Date: 08/11/2024  Today's healthcare provider: Jolynn Spencer, PA-C   Chief Complaint  Patient presents with   Annual Exam    Sleep of pattern: depends on the night, about 7-8 hours of sleep Exercise: No regimen, only chasing the kids No concerns   Subjective    Kristy Knight is a 28 y.o. female who presents today for a complete physical exam.   HPI     Annual Exam    Additional comments: Sleep of pattern: depends on the night, about 7-8 hours of sleep Exercise: No regimen, only chasing the kids No concerns      Last edited by Wilfred Hargis RAMAN, CMA on 08/11/2024  9:52 AM.      Discussed the use of AI scribe software for clinical note transcription with the patient, who gave verbal consent to proceed.  History of Present Illness Kristy Knight is a 28 year old female who presents for a routine physical exam.  Her sleep pattern varies, often achieving seven to eight hours depending on her baby. She does not engage in regular exercise. She quit smoking on August 5th and has noted improvement in postnatal pain since then. She denies vaping and is not breastfeeding. Her current medications include Zoloft , hydroxyzine , Buspar , and propranolol, with improved appetite since consistent use. She experiences occasional vaginal discharge and is open to HIV screening. She plans a trip and prefers to defer vaccinations, including flu, COVID, and pneumonia vaccines.    Last depression screening scores    03/27/2024    1:45 PM 02/14/2024    1:15 PM 02/11/2024    4:55 PM  PHQ 2/9 Scores  PHQ - 2 Score 2 1 0  PHQ- 9 Score 18 15 8    Last fall risk screening    08/11/2024    9:55 AM  Fall Risk   Falls in the past year? 1  Number falls in past yr: 1  Injury with Fall? 1  Risk for fall due to : No Fall Risks  Follow up Falls evaluation completed   Last Audit-C  alcohol use screening    04/09/2024    3:19 PM  Alcohol Use Disorder Test (AUDIT)  1. How often do you have a drink containing alcohol? 0  2. How many drinks containing alcohol do you have on a typical day when you are drinking? 4  3. How often do you have six or more drinks on one occasion? 0  AUDIT-C Score 4   4. How often during the last year have you found that you were not able to stop drinking once you had started? 0  5. How often during the last year have you failed to do what was normally expected from you because of drinking? 0  6. How often during the last year have you needed a first drink in the morning to get yourself going after a heavy drinking session? 0  7. How often during the last year have you had a feeling of guilt of remorse after drinking? 0  8. How often during the last year have you been unable to remember what happened the night before because you had been drinking? 0  9. Have you or someone else been injured as a result of your drinking? 0  10. Has a relative or friend or a doctor or another health worker been concerned  about your drinking or suggested you cut down? 0  Alcohol Use Disorder Identification Test Final Score (AUDIT) 4      Patient-reported   A score of 3 or more in women, and 4 or more in men indicates increased risk for alcohol abuse, EXCEPT if all of the points are from question 1   Past Medical History:  Diagnosis Date   Anxiety    Pott's disease    History reviewed. No pertinent surgical history. Social History   Socioeconomic History   Marital status: Single    Spouse name: Not on file   Number of children: Not on file   Years of education: Not on file   Highest education level: Some college, no degree  Occupational History   Not on file  Tobacco Use   Smoking status: Every Day    Current packs/day: 1.00    Types: Cigarettes   Smokeless tobacco: Never  Vaping Use   Vaping status: Never Used  Substance and Sexual Activity    Alcohol use: No   Drug use: No   Sexual activity: Not Currently  Other Topics Concern   Not on file  Social History Narrative   Not on file   Social Drivers of Health   Financial Resource Strain: Patient Declined (04/09/2024)   Overall Financial Resource Strain (CARDIA)    Difficulty of Paying Living Expenses: Patient declined  Food Insecurity: No Food Insecurity (08/11/2024)   Hunger Vital Sign    Worried About Running Out of Food in the Last Year: Never true    Ran Out of Food in the Last Year: Never true  Transportation Needs: No Transportation Needs (08/11/2024)   PRAPARE - Administrator, Civil Service (Medical): No    Lack of Transportation (Non-Medical): No  Physical Activity: Insufficiently Active (04/09/2024)   Exercise Vital Sign    Days of Exercise per Week: 3 days    Minutes of Exercise per Session: 20 min  Stress: Stress Concern Present (04/09/2024)   [employer] of Occupational Health - Occupational Stress Questionnaire    Feeling of Stress : Very much  Social Connections: Unknown (04/09/2024)   Social Connection and Isolation Panel    Frequency of Communication with Friends and Family: Once a week    Frequency of Social Gatherings with Friends and Family: Never    Attends Religious Services: 1 to 4 times per year    Active Member of [employer] or Organizations: Yes    Attends Banker Meetings: 1 to 4 times per year    Marital Status: Patient declined  Intimate Partner Violence: Not At Risk (03/18/2024)   Humiliation, Afraid, Rape, and Kick questionnaire    Fear of Current or Ex-Partner: No    Emotionally Abused: No    Physically Abused: No    Sexually Abused: No   Family Status  Relation Name Status   Mother  Alive   Father  Alive  No partnership data on file   History reviewed. No pertinent family history. No Known Allergies  Patient Care Team: Enrique Weiss, PA-C as PCP - General (Physician Assistant)   Medications: Outpatient  Medications Prior to Visit  Medication Sig   Acetaminophen  (TYLENOL  PO) Take 500 mg by mouth.   busPIRone  (BUSPAR ) 5 MG tablet Take 1 tablet (5 mg total) by mouth 2 (two) times daily.   hydrOXYzine  (ATARAX ) 25 MG tablet TAKE 1 TABLET BY MOUTH 2 TIMES DAILY AS NEEDED FOR ANXIETY.   loratadine (CLARITIN) 10  MG tablet Take 10 mg by mouth daily.   methocarbamol  (ROBAXIN ) 500 MG tablet Take 1 tablet (500 mg total) by mouth every 8 (eight) hours as needed for muscle spasms.   Multiple Vitamin (MULTIVITAMIN) tablet Take 1 tablet by mouth daily.   propranolol (INDERAL) 20 MG tablet Take 20 mg by mouth 3 (three) times daily as needed.   sertraline  (ZOLOFT ) 100 MG tablet Take 1 tablet (100 mg total) by mouth daily.   No facility-administered medications prior to visit.    Review of Systems  All other systems reviewed and are negative.  Except see HPI  {Insert previous labs (optional):23779} {See past labs  Heme  Chem  Endocrine  Serology  Results Review (optional):1}  Objective    BP 102/72   Pulse 78   Resp 14   Ht 5' 2 (1.575 m)   Wt 168 lb 11.2 oz (76.5 kg)   LMP 08/08/2024 Comment: had cycle twice this month started 07/24/24 ended 07/29/24 started again 08/08/24  SpO2 100%   BMI 30.86 kg/m  {Insert last BP/Wt (optional):23777}{See vitals history (optional):1}    Physical Exam Vitals reviewed.  Constitutional:      General: She is not in acute distress.    Appearance: Normal appearance. She is well-developed. She is not ill-appearing, toxic-appearing or diaphoretic.  HENT:     Head: Normocephalic and atraumatic.     Right Ear: Tympanic membrane, ear canal and external ear normal.     Left Ear: Tympanic membrane, ear canal and external ear normal.     Nose: Nose normal. No congestion or rhinorrhea.     Mouth/Throat:     Mouth: Mucous membranes are moist.     Pharynx: Oropharynx is clear. No oropharyngeal exudate.  Eyes:     General: No scleral icterus.       Right eye: No  discharge.        Left eye: No discharge.     Conjunctiva/sclera: Conjunctivae normal.     Pupils: Pupils are equal, round, and reactive to light.  Neck:     Thyroid: No thyromegaly.     Vascular: No carotid bruit.  Cardiovascular:     Rate and Rhythm: Normal rate and regular rhythm.     Pulses: Normal pulses.     Heart sounds: Normal heart sounds. No murmur heard.    No friction rub. No gallop.  Pulmonary:     Effort: Pulmonary effort is normal. No respiratory distress.     Breath sounds: Normal breath sounds. No wheezing or rales.  Abdominal:     General: Abdomen is flat. Bowel sounds are normal. There is no distension.     Palpations: Abdomen is soft. There is no mass.     Tenderness: There is no abdominal tenderness. There is no right CVA tenderness, left CVA tenderness, guarding or rebound.     Hernia: No hernia is present.  Musculoskeletal:        General: No swelling, tenderness, deformity or signs of injury. Normal range of motion.     Cervical back: Normal range of motion and neck supple. No rigidity or tenderness.     Right lower leg: No edema.     Left lower leg: No edema.  Lymphadenopathy:     Cervical: No cervical adenopathy.  Skin:    General: Skin is warm and dry.     Coloration: Skin is not jaundiced or pale.     Findings: No bruising, erythema, lesion or rash.  Neurological:  Mental Status: She is alert and oriented to person, place, and time. Mental status is at baseline.     Gait: Gait normal.  Psychiatric:        Mood and Affect: Mood normal.        Behavior: Behavior normal.        Thought Content: Thought content normal.        Judgment: Judgment normal.      No results found for any visits on 08/11/24.  Assessment & Plan    Routine Health Maintenance and Physical Exam  Exercise Activities and Dietary recommendations  Goals      Quit Smoking        Immunization History  Administered Date(s) Administered    sv, Bivalent, Protein Subunit  Rsvpref,pf [place]) 08/30/2023   DTP / HiB 10/30/1995, 01/01/1996, 03/03/1996   DTaP 09/02/1996, 06/07/2000   HIB, Unspecified 09/02/1996   HPV Quadrivalent 04/01/2007, 06/05/2007, 10/07/2007   Hep B, Unspecified 16-Jun-1995, 10/30/1995, 03/03/1996   Hepatitis A, Ped/Adol-2 Dose 04/01/2007, 10/07/2007   IPV 06/07/2000   Influenza, Seasonal, Injecte, Preservative Fre 08/16/2023   MMR 09/02/1996, 06/07/2000   OPV 10/30/1995, 01/01/1996, 03/03/1996   Tdap 04/01/2007, 07/19/2023    Health Maintenance  Topic Date Due   COVID-19 Vaccine (1) Never done   HIV Screening  Never done   Pneumococcal Vaccine (1 of 2 - PCV) Never done   Flu Shot  06/26/2024   Pap Smear  03/26/2026   DTaP/Tdap/Td vaccine (8 - Td or Tdap) 07/18/2033   HPV Vaccine  Completed   Hepatitis C Screening  Completed   Meningitis B Vaccine  Aged Out    Discussed health benefits of physical activity, and encouraged her to engage in regular exercise appropriate for her age and condition.   Assessment & Plan Vaginal Discharge Reports discharge without urinary symptoms. - Provide self-swab for STD screening. - Perform comprehensive STD screening including chlamydia, gonorrhea, bacterial vaginosis, yeast infection, and trichomoniasis. - Perform HIV screening.  Depression and Anxiety Disorder Chronic depression and anxiety managed with Zoloft , hydroxyzine , Buspar , and propranolol. Open to counseling. - Refer to counseling services available in the office. - Continue current medications: Zoloft , hydroxyzine , Buspar , and propranolol. - Schedule follow-up for chronic conditions in 3 to 4 months.  General Health Maintenance Declined vaccinations due to travel and past adverse reactions. Plans to consider flu, COVID, and pneumonia vaccines later. - Discuss vaccinations (flu, COVID, pneumonia) at a future visit.  Collaboration of Care: Medication Management AEB ***, Primary Care Provider AEB ***, Psychiatrist AEB ***, and  Referral or follow-up with counselor/therapist AEB ***  Patient/Guardian was advised Release of Information must be obtained prior to any record release in order to collaborate their care with an outside provider. Patient/Guardian was advised if they have not already done so to contact the registration department to sign all necessary forms in order for us  to release information regarding their care.   Consent: Patient/Guardian gives verbal consent for treatment and assignment of benefits for services provided during this visit. Patient/Guardian expressed understanding and agreed to proceed.    No follow-ups on file.    The patient was advised to call back or seek an in-person evaluation if the symptoms worsen or if the condition fails to improve as anticipated.  I discussed the assessment and treatment plan with the patient. The patient was provided an opportunity to ask questions and all were answered. The patient agreed with the plan and demonstrated an understanding of the instructions.  I,  Lovada Barwick, PA-C have reviewed all documentation for this visit. The documentation on 08/11/2024  for the exam, diagnosis, procedures, and orders are all accurate and complete.  Jolynn Spencer, [facility], MMS [facility] [phone] (phone) [phone] (fax)  [facility]

## 2024-08-12 ENCOUNTER — Ambulatory Visit: Payer: Self-pay | Admitting: Physician Assistant

## 2024-08-12 DIAGNOSIS — B9689 Other specified bacterial agents as the cause of diseases classified elsewhere: Secondary | ICD-10-CM

## 2024-08-12 DIAGNOSIS — N898 Other specified noninflammatory disorders of vagina: Secondary | ICD-10-CM

## 2024-08-12 LAB — CERVICOVAGINAL ANCILLARY ONLY
Bacterial Vaginitis (gardnerella): POSITIVE — AB
Candida Glabrata: NEGATIVE
Candida Vaginitis: NEGATIVE
Chlamydia: NEGATIVE
Comment: NEGATIVE
Comment: NEGATIVE
Comment: NEGATIVE
Comment: NEGATIVE
Comment: NEGATIVE
Comment: NORMAL
Neisseria Gonorrhea: NEGATIVE
Trichomonas: NEGATIVE

## 2024-08-12 LAB — HIV ANTIBODY (ROUTINE TESTING W REFLEX): HIV Screen 4th Generation wRfx: NONREACTIVE

## 2024-08-12 MED ORDER — METRONIDAZOLE 500 MG PO TABS: 500.0000 mg | ORAL_TABLET | Freq: Two times a day (BID) | ORAL | 0 refills | Status: AC

## 2024-11-10 ENCOUNTER — Encounter: Payer: Self-pay | Admitting: Physician Assistant

## 2024-11-10 ENCOUNTER — Ambulatory Visit (INDEPENDENT_AMBULATORY_CARE_PROVIDER_SITE_OTHER): Payer: MEDICAID | Admitting: Physician Assistant

## 2024-11-10 VITALS — BP 108/79 | HR 76 | Resp 14 | Ht 62.0 in | Wt 191.7 lb

## 2024-11-10 DIAGNOSIS — F419 Anxiety disorder, unspecified: Secondary | ICD-10-CM

## 2024-11-10 DIAGNOSIS — R002 Palpitations: Secondary | ICD-10-CM

## 2024-11-10 DIAGNOSIS — F3341 Major depressive disorder, recurrent, in partial remission: Secondary | ICD-10-CM | POA: Diagnosis not present

## 2024-11-10 DIAGNOSIS — F840 Autistic disorder: Secondary | ICD-10-CM

## 2024-11-10 DIAGNOSIS — Z23 Encounter for immunization: Secondary | ICD-10-CM

## 2024-11-10 NOTE — Progress Notes (Unsigned)
 Established patient visit  Patient: Kristy Knight   DOB: 04/03/1996   28 y.o. Female  MRN: [id number] Visit Date: 11/10/2024  Today's healthcare provider: Jolynn Spencer, PA-C   Chief Complaint  Patient presents with   Medical Management of Chronic Issues    3-4 month f/u- reports no changes and doing good.    Subjective     HPI     Medical Management of Chronic Issues    Additional comments: 3-4 month f/u- reports no changes and doing good.       Last edited by Wilfred Hargis RAMAN, CMA on 11/10/2024 10:25 AM.       Discussed the use of AI scribe software for clinical note transcription with the patient, who gave verbal consent to proceed.  History of Present Illness Kristy Knight is a 28 year old female who presents for medication management and follow-up for depression and anxiety.  She takes Zoloft  100 mg daily, Buspar  5 mg twice daily, and hydroxyzine  as needed, which she uses less often. She is able to manage daily activities, including cleaning and caring for her daughter, who is now sleeping through the night.  She has occasional chest palpitations and uses propranolol as needed. Prior cardiology monitoring and echocardiogram showed normal heart structure and function, and her symptoms improved postpartum.  She attends weekly therapy sessions through [facility], which she finds helpful, and is not followed by psychiatry.       03/27/2024    1:45 PM 02/14/2024    1:15 PM 02/11/2024    4:55 PM  Depression screen PHQ 2/9  Decreased Interest 1 0 0  Down, Depressed, Hopeless 1 1 0  PHQ - 2 Score 2 1 0  Altered sleeping 3 3 3   Tired, decreased energy 3 2 1   Change in appetite 2 2 1   Feeling bad or failure about yourself  2 3 2   Trouble concentrating 2 1 0  Moving slowly or fidgety/restless 3 3 1   Suicidal thoughts 1 0 0  PHQ-9 Score 18  15  8    Difficult doing work/chores Extremely dIfficult Extremely dIfficult Very difficult     Data saved with a previous  flowsheet row definition      03/27/2024    1:45 PM 02/14/2024    1:15 PM 02/11/2024    4:56 PM 01/03/2021   11:07 AM  GAD 7 : Generalized Anxiety Score  Nervous, Anxious, on Edge 3 3 3    Control/stop worrying 3 3 3    Worry too much - different things 3 3 3    Trouble relaxing 3 3 3    Restless 3 2 3    Easily annoyed or irritable 3 1 2    Afraid - awful might happen 3 3 3    Total GAD 7 Score 21 18 20    Anxiety Difficulty Extremely difficult Extremely difficult       Information is confidential and restricted. Go to Review Flowsheets to unlock data.    Medications: Show/hide medication list[1]  Review of Systems All negative Except see HPI       Objective    BP 108/79   Pulse 76   Resp 14   Ht 5' 2 (1.575 m)   Wt 191 lb 11.2 oz (87 kg)   LMP 10/16/2024   SpO2 99%   BMI 35.06 kg/m     Physical Exam Vitals reviewed.  Constitutional:      General: She is not in acute distress.    Appearance: Normal appearance. She is  well-developed. She is not diaphoretic.  HENT:     Head: Normocephalic and atraumatic.  Eyes:     General: No scleral icterus.    Conjunctiva/sclera: Conjunctivae normal.  Neck:     Thyroid: No thyromegaly.  Cardiovascular:     Rate and Rhythm: Normal rate and regular rhythm.     Pulses: Normal pulses.     Heart sounds: Normal heart sounds. No murmur heard. Pulmonary:     Effort: Pulmonary effort is normal. No respiratory distress.     Breath sounds: Normal breath sounds. No wheezing, rhonchi or rales.  Musculoskeletal:     Cervical back: Neck supple.     Right lower leg: No edema.     Left lower leg: No edema.  Lymphadenopathy:     Cervical: No cervical adenopathy.  Skin:    General: Skin is warm and dry.     Findings: No rash.  Neurological:     Mental Status: She is alert and oriented to person, place, and time. Mental status is at baseline.  Psychiatric:        Mood and Affect: Mood normal.        Behavior: Behavior normal.      No  results found for any visits on 11/10/24.      Assessment & Plan Major depressive disorder and generalized anxiety disorder Chronic and stable Symptoms improved with current medication regimen. Comfortable with treatment. Weekly therapy ongoing. - Continue sertraline  100 mg daily. - Continue buspirone  5 mg twice daily. - Use hydroxyzine  as needed. - Continue weekly therapy sessions. - Scheduled follow-up in four months. Will follow-up  Palpitations Intermittent palpitations likely anxiety-related. Normal cardiac evaluations. Symptoms normalized post-pregnancy. - Continue propranolol as needed for palpitations. FOLLOW-UP with cardiology - Scheduled follow-up in four months.  Immunization due (Primary) - Flu vaccine trivalent PF, 6mos and older(Flulaval,Afluria,Fluarix,Fluzone) X for the first time, was monitored after injection for the 15' In order to decrease the risk of injury for potential syncope   Autism spectrum disorder Was diagnosed previously.  Was referred to psychology for an evaluation Was referred to psychiatry, unsuccessfully. Will revisit at the follow-up   Orders Placed This Encounter  Procedures   Flu vaccine trivalent PF, 6mos and older(Flulaval,Afluria,Fluarix,Fluzone)    No follow-ups on file.   The patient was advised to call back or seek an in-person evaluation if the symptoms worsen or if the condition fails to improve as anticipated.  I discussed the assessment and treatment plan with the patient. The patient was provided an opportunity to ask questions and all were answered. The patient agreed with the plan and demonstrated an understanding of the instructions.  I, Cecillia Menees, PA-C have reviewed all documentation for this visit. The documentation on 11/10/2024  for the exam, diagnosis, procedures, and orders are all accurate and complete.  Jolynn Spencer, [facility], MMS [facility] [phone] (phone) [phone] (fax)  [facility]     [1]  Outpatient Medications Prior to Visit  Medication Sig   Acetaminophen  (TYLENOL  PO) Take 500 mg by mouth.   busPIRone  (BUSPAR ) 5 MG tablet Take 1 tablet (5 mg total) by mouth 2 (two) times daily.   hydrOXYzine  (ATARAX ) 25 MG tablet TAKE 1 TABLET BY MOUTH 2 TIMES DAILY AS NEEDED FOR ANXIETY.   loratadine (CLARITIN) 10 MG tablet Take 10 mg by mouth daily.   methocarbamol  (ROBAXIN ) 500 MG tablet Take 1 tablet (500 mg total) by mouth every 8 (eight) hours as needed for muscle  spasms.   Multiple Vitamin (MULTIVITAMIN) tablet Take 1 tablet by mouth daily.   propranolol (INDERAL) 20 MG tablet Take 20 mg by mouth 3 (three) times daily as needed.   sertraline  (ZOLOFT ) 100 MG tablet Take 1 tablet (100 mg total) by mouth daily.   No facility-administered medications prior to visit.

## 2024-12-10 ENCOUNTER — Other Ambulatory Visit: Payer: Self-pay | Admitting: Physician Assistant

## 2024-12-10 DIAGNOSIS — F3341 Major depressive disorder, recurrent, in partial remission: Secondary | ICD-10-CM

## 2024-12-10 DIAGNOSIS — F419 Anxiety disorder, unspecified: Secondary | ICD-10-CM

## 2024-12-10 NOTE — Telephone Encounter (Signed)
 LOV- 11/10/2024 NOV- 03/11/2025 [facility]- 03/27/2024 Outpatient Medication Detail   Disp Refills Start End   sertraline  (ZOLOFT ) 100 MG tablet 90 tablet 1 03/27/2024 --   Sig - Route: Take 1 tablet (100 mg total) by mouth daily. - Oral   Sent to pharmacy as: sertraline  (ZOLOFT ) 100 MG tablet   E-Prescribing Status: Receipt confirmed by pharmacy (03/27/2024  1:52 PM EDT)

## 2025-03-11 ENCOUNTER — Ambulatory Visit: Payer: MEDICAID | Admitting: Physician Assistant

## 2025-08-11 ENCOUNTER — Encounter: Payer: MEDICAID | Admitting: Physician Assistant
